# Patient Record
Sex: Female | Born: 1990 | Race: Black or African American | Hispanic: No | Marital: Single | Smoking: Former smoker
Health system: Southern US, Community
[De-identification: ages and names within clinical notes are randomized; demographics above are authoritative.]

## PROBLEM LIST (undated history)

## (undated) ENCOUNTER — Inpatient Hospital Stay (HOSPITAL_COMMUNITY): Payer: Self-pay

## (undated) DIAGNOSIS — D649 Anemia, unspecified: Secondary | ICD-10-CM

## (undated) DIAGNOSIS — I10 Essential (primary) hypertension: Secondary | ICD-10-CM

---
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
## 2010-02-06 ENCOUNTER — Encounter (INDEPENDENT_AMBULATORY_CARE_PROVIDER_SITE_OTHER): Payer: Self-pay | Admitting: *Deleted

## 2010-02-18 ENCOUNTER — Ambulatory Visit: Payer: Self-pay | Admitting: Family

## 2010-02-18 DIAGNOSIS — J45991 Cough variant asthma: Secondary | ICD-10-CM | POA: Insufficient documentation

## 2010-02-18 DIAGNOSIS — R030 Elevated blood-pressure reading, without diagnosis of hypertension: Secondary | ICD-10-CM | POA: Insufficient documentation

## 2011-01-07 NOTE — Letter (Signed)
Summary: New Patient Letter  [facility]  [address] [place], [place] [address]   Phone: [phone]  Fax: [phone]       02/06/2010 MRN: [id number]  ARLETTE SCHAAD [address] [place], [place]  [address]  Dear Ms. Ronn Melena,   Welcome to [facility] and thank you for choosing [place] as your Primary Care Providers. Enclosed you will find information about our practice that we hope you find helpful. We have also enclosed forms to be filled out prior to your visit. This will provide [place] with the necessary information and facilitate your being seen in a timely manner. If you have any questions, please call us at:  [phone]        and we will be happy to assist you. We look forward to seeing you at your scheduled appointment time.  Appointment   MARCH [phone] @ 9:00AM            with .  MELISSA O'SULLIVAN               Sincerely,  Primary Health Care Team  Please arrive 15 minutes early for your first appointment and bring your insurance card. Co-pay is required at the time of your visit.  *****Please call the office if you are not able to keep this appointment. There is a charge of $50.00 if any appointment is not cancelled or rescheduled within 24 hours.

## 2011-01-07 NOTE — Assessment & Plan Note (Signed)
Summary: NEW PT/BCBS/NS/KDC   Vital Signs:  Patient profile:   20 year old female Height:      71 inches Weight:      228 pounds BMI:     31.91 O2 Sat:      99 % on Room air Temp:     97.4 degrees F oral Pulse rate:   81 / minute Pulse rhythm:   regular Resp:     12 per minute BP sitting:   140 / 78  (left arm) Cuff size:   regular  Vitals Entered By: Mervin Kung CMA (February 18, 2010 9:07 AM)  O2 Flow:  Room air Is Patient Diabetic? No   History of Present Illness: Tammy Hull is an 21 year old female who presents today to establish care- she was previously seen at [facility].  Patient notes that she has 4 year history of intermittentsubsternal chest pain- comes and goes.  Pain is sharp in nature- lasts seconds to few minutes.  Notes + cough which is intermittent in nature.  She denies history of asthma, but notes + family history of asthma.     Preventative-  never had a pap smear.  Dances twice a week.  Up to date on tetanus and flu shot.  Notes that her diet consists of a lot of junk food.  Tells me that she has never been sexually active.    Preventive Screening-Counseling & Management  Alcohol-Tobacco     Alcohol drinks/day: 0     Smoking Status: never  Caffeine-Diet-Exercise     Caffeine use/day: none     Does Patient Exercise: yes     Type of exercise: dance     Exercise (avg: min/session): >60     Times/week: <3  Allergies (verified): No Known Drug Allergies  Family History: Family History of Arthritis--great grandmother Family History of Asthma--father Family History Diabetes 1st degree relative--paternal grandmother Family History Hypertension--mother,father, maternal,paternal grandparents Family History High cholesterol--paternal grandmother Family History Lung --maternal grandfather,deceased  Mom- HTN Dad- HTN, asthma  2 half sisters-  healthy  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 Resp: intermittent cough CV:  see HPI GI:  Denies nausea or vomitting GU: Denies dysuria Lymphatic: Denies lymphadenopathy Musculoskeletal:  Denies muscle/joint pain, occasional back pain Skin:  Denies Rashes Psychiatric: Denies depression Neuro: Denies numbness     Physical Exam  General:  Overweight AA female in NAD Head:  Normocephalic and atraumatic without obvious abnormalities. No apparent alopecia or balding. Eyes:  PERRLA Ears:  External ear exam shows no significant lesions or deformities.  Otoscopic examination reveals clear canals, tympanic membranes are intact bilaterally without bulging, retraction, inflammation or discharge. Hearing is grossly normal bilaterally. Mouth:  Oral mucosa and oropharynx without lesions or exudates.  Teeth in good repair. Neck:  No deformities, masses, or tenderness noted. Breasts:  No mass, nodules, thickening, tenderness, bulging, retraction, inflamation, nipple discharge or skin changes noted.   Lungs:  Normal respiratory effort, chest expands symmetrically. Lungs are clear to auscultation, no crackles or wheezes. Heart:  Normal rate and regular rhythm. S1 and S2 normal without gallop, murmur, click, rub or other extra sounds. Abdomen:  Bowel sounds positive,abdomen soft and non-tender without masses, organomegaly or hernias noted. Msk:  No deformity  or scoliosis noted of thoracic or lumbar spine.   Extremities:  No clubbing, cyanosis, edema, or deformity noted with normal full range of motion of all joints.   Neurologic:  No cranial nerve deficits noted. Station and gait are normal. Plantar reflexes are down-going bilaterally. DTRs are symmetrical throughout. Sensory, motor and coordinative functions appear intact. Skin:  Intact  without suspicious lesions or rashes   Impression & Recommendations:  Problem # 1:  Preventive Health Care (ICD-V70.0) Assessment Comment Only Patient advised to try to eat 3 healthy meals a day and avoid junk food.  Also advised to try to exercise 5x a week.  Pt educated on BSE and safe sex.  Will plan for pap at age 24 or when patient becomes sexually active.    Problem # 2:  INCREASED BLOOD PRESSURE (ICD-796.2) Assessment: New new finding, advised patient to follow a low sodium diet, try to lose weight.  Plan f/u in 1 month, if still elevated will start meds at that time.    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  I suspect that patient's chest discomfort is likely related to asthma.  Strong family history and patient has + history of cough.  Will give trial of advair/proair.   Will give trial of advair pt given [id number] with proair as needed [phone]  Her updated medication list for this problem includes:    Advair Diskus 100-50 Mcg/dose Aepb (Fluticasone-salmeterol) ..... One puff twice daily    Proair Hfa 108 (90 Base) Mcg/act Aers (Albuterol sulfate) .Marland Kitchen... 2 puffs every 6 hours as needed  Complete Medication List: 1)  Advair Diskus 100-50 Mcg/dose Aepb (Fluticasone-salmeterol) .... One puff twice daily 2)  Proair Hfa 108 (90 Base) Mcg/act Aers (Albuterol sulfate) .... 2 puffs every 6 hours as needed  Patient Instructions: 1)  It is important that you exercise regularly at least 20 minutes 5 times a week. If you develop chest pain, have severe difficulty breathing, or feel very tired , stop exercising immediately and seek medical attention. 2)  You need to lose weight. Consider a lower calorie diet and regular exercise.  3)  Please follow up in 1 month. 4)  Limit your Sodium (Salt). Prescriptions: ADVAIR DISKUS 100-50 MCG/DOSE AEPB (FLUTICASONE-SALMETEROL) one puff twice daily  #1 x 0   Entered and Authorized by:   Lemont Fillers FNP   Signed by:   Lemont Fillers FNP on 02/18/2010   Method used:   Electronically to        [facility] (retail)       [address]       [place], [place]  [address]       Ph: [phone]       Fax: [phone]   RxID:   [id number]   Current Allergies (reviewed today): No known allergies    Vital Signs:  Patient Profile:   20 year old female Height:     71 inches Weight:      228 pounds BMI:     31.91 O2 Sat:      99 % Temp:     97.4 degrees F oral Pulse rate:   81 / minute Pulse rhythm:   regular Resp:     12 per minute BP sitting:   140 / 78 Cuff size:   regular  Preventive Care Screening  Last Tetanus Booster:    Date:  07/07/2009    Results:  Historical     Immunization History:  Influenza Immunization History:    Influenza:  historical (12/15/2009)

## 2011-01-19 ENCOUNTER — Ambulatory Visit (INDEPENDENT_AMBULATORY_CARE_PROVIDER_SITE_OTHER): Payer: BC Managed Care – PPO | Admitting: Family Medicine

## 2011-01-19 ENCOUNTER — Other Ambulatory Visit: Payer: Self-pay | Admitting: Family Medicine

## 2011-01-19 ENCOUNTER — Encounter: Payer: Self-pay | Admitting: Family Medicine

## 2011-01-19 DIAGNOSIS — K219 Gastro-esophageal reflux disease without esophagitis: Secondary | ICD-10-CM | POA: Insufficient documentation

## 2011-01-19 DIAGNOSIS — R209 Unspecified disturbances of skin sensation: Secondary | ICD-10-CM

## 2011-01-19 DIAGNOSIS — R109 Unspecified abdominal pain: Secondary | ICD-10-CM

## 2011-01-19 DIAGNOSIS — R079 Chest pain, unspecified: Secondary | ICD-10-CM | POA: Insufficient documentation

## 2011-01-19 LAB — TSH: TSH: 1.61 u[IU]/mL (ref 0.35–5.50)

## 2011-01-19 LAB — CBC WITH DIFFERENTIAL/PLATELET
Basophils Absolute: 0 10*3/uL (ref 0.0–0.1)
Eosinophils Absolute: 0 10*3/uL (ref 0.0–0.7)
Eosinophils Relative: 0.6 % (ref 0.0–5.0)
Lymphs Abs: 2.6 10*3/uL (ref 0.7–4.0)
MCHC: 33.5 g/dL (ref 30.0–36.0)
MCV: 85.2 fl (ref 78.0–100.0)
Monocytes Absolute: 0.4 10*3/uL (ref 0.1–1.0)
Neutrophils Relative %: 54.6 % (ref 43.0–77.0)
Platelets: 245 10*3/uL (ref 150.0–400.0)
RDW: 16 % — ABNORMAL HIGH (ref 11.5–14.6)
WBC: 6.9 10*3/uL (ref 4.5–10.5)

## 2011-01-19 LAB — BASIC METABOLIC PANEL
Calcium: 9.1 mg/dL (ref 8.4–10.5)
GFR: 123.34 mL/min (ref >60.00)
Potassium: 3.9 mEq/L (ref 3.5–5.1)
Sodium: 140 mEq/L (ref 135–145)

## 2011-01-19 LAB — B12 AND FOLATE PANEL
Folate: 13.3 ng/mL (ref >5.9)
Vitamin B-12: 348 pg/mL (ref 211–911)

## 2011-01-28 ENCOUNTER — Ambulatory Visit: Payer: BC Managed Care – PPO | Admitting: Family Medicine

## 2011-02-02 NOTE — Assessment & Plan Note (Signed)
Summary: new pt --transferring from [facility]--bcbs--needs to talk ab...   Vital Signs:  Patient profile:   20 year old female Height:      71 inches Weight:      228 pounds BMI:     31.91 Pulse rate:   72 / minute BP sitting:   112 / 74  (left arm)  Vitals Entered By: Doristine Devoid CMA (January 19, 2011 8:03 AM) CC: NEW EST- hx of heartburn, numbness and tingling in foot and hands, along w/ urinary   History of Present Illness: 20 yo woman here today to establish care.  Previous MD- Peggyann Juba.  parasthesias- sxs will occur most frequently in feet.  occasionally in hands.  infrequently.  sxs have been occuring for a few months.  'felt like when you play in the snow and your hands get real cold and then you put hot water on them- hurts like that'.  nothing makes pain better or worse.  not related to any type of footwear.  sxs occur from ball of foot to toes.  suprapubic pain- occurs when pt wakes up.  not daily.  improves after urination.  no blood in urine.  chest pain- has been happening for 'years'.  'i've seen doctors but no one can find anything'.  started in center of chest.  moved to R side.  now predominately L sided.  will resolve spontaneously.  worse w/ 'hot food'.  GERD- 'real bad pain', has never taken meds.  worse w/ spicy food.  will have sour brash.  worse w/ lying down.  Preventive Screening-Counseling & Management  Alcohol-Tobacco     Smoking Status: never  Caffeine-Diet-Exercise     Does Patient Exercise: no      Drug Use:  never.    Current Medications (verified): 1)  Omeprazole 40 Mg Cpdr (Omeprazole) .Marland Kitchen.. 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 No apparent alopecia or balding. Eyes:  PERRLA, EOMI Neck:  No deformities, masses, or tenderness noted. Lungs:  Normal respiratory effort, chest expands symmetrically. Lungs are clear to auscultation, no crackles or wheezes. Heart:  Normal rate and regular rhythm. S1 and S2 normal without gallop, murmur, click, rub or other extra sounds. Abdomen:  Bowel sounds positive,abdomen soft and non-tender without masses, organomegaly or hernias noted. Msk:  fallen transverse arches bilaterally Pulses:  +2 carotid, radial, DP Extremities:  no C/C/E Neurologic:  alert & oriented X3, cranial nerves II-XII intact, sensation intact to light touch, gait normal, and DTRs symmetrical and normal.   Skin:  Intact without suspicious lesions or rashes Cervical Nodes:  No lymphadenopathy noted Psych:  Cognition and judgment appear intact. Alert and cooperative with normal attention span and concentration. No apparent delusions, illusions, hallucinations   Impression & Recommendations:  Problem # 1:  PARESTHESIA (ICD-782.0) Assessment New check labs to r/o thyroid problems, anemia, electrolyte disturbance.  pt most likely having numbness in feet due to collapsed transverse arches.  refer to sports med to offload pressure w/ orthotics. Orders: Specimen Handling (04540) TLB-CBC Platelet - w/Differential (85025-CBCD) TLB-BMP (Basic Metabolic Panel-BMET) (80048-METABOL) TLB-TSH (Thyroid  Stimulating Hormone) (84443-TSH) TLB-B12 + Folate Pnl (60454_09811-B14/NWG) Sports Medicine (Sports Med)  Problem # 2:  SUPRAPUBIC PAIN (ICD-789.09) Assessment: New pt asymptomatic today.  reviewed that sxs are most likely due to overdistension of bladder as things improve after urination.  will follow.  Problem # 3:  CHEST PAIN (ICD-786.50) Assessment: New most likely related to untreated GERD.  has had multiple  workups w/out results.  EKG in office today w/out evidence of cardiac cause.  start PPI and f/u.  Problem # 4:  GERD (ICD-530.81) Assessment: New check H pylori to r/o PUD.  start PPI.   Orders: Venipuncture (95621) Specimen Handling (30865) EKG w/ Interpretation (93000) TLB-H. Pylori Abs(Helicobacter Pylori) (86677-HELICO)  Her updated medication list for this problem includes:    Omeprazole 40 Mg Cpdr (Omeprazole) .Marland Kitchen... 1 tab by mouth daily for heartburn  Complete Medication List: 1)  Omeprazole 40 Mg Cpdr (Omeprazole) .Marland Kitchen.. 1 tab by mouth daily for heartburn  Patient Instructions: 1)  Follow up in 1 month to recheck heartburn 2)  Start the Omeprazole daily for the heartburn 3)  Someone will call you with your sports med appt 4)  We'll notify you of your lab results 5)  Call with any questions or concerns 6)  Welcome!  We're glad to have you! Prescriptions: OMEPRAZOLE 40 MG CPDR (OMEPRAZOLE) 1 tab by mouth daily for heartburn  #30 x 3   Entered and Authorized by:   Neena Rhymes MD   Signed by:   Neena Rhymes MD on 01/19/2011   Method used:   Electronically to        [facility] (retail)       [address]       [place], [place]  [address]       Ph: [phone]       Fax: [phone]   RxID:   [id number]    Orders Added: 1)  Venipuncture [75643] 2)  Specimen Handling [99000] 3)  EKG w/ Interpretation [93000] 4)  TLB-H. Pylori Abs(Helicobacter Pylori) [86677-HELICO] 5)  TLB-CBC Platelet - w/Differential [85025-CBCD] 6)  TLB-BMP (Basic Metabolic Panel-BMET) [80048-METABOL] 7)  TLB-TSH (Thyroid Stimulating Hormone) [84443-TSH] 8)  TLB-B12 + Folate Pnl [82746_82607-B12/FOL] 9)  Est. Patient Level IV [32951] 10)  Sports Medicine [Sports Med]

## 2011-02-10 ENCOUNTER — Encounter: Payer: Self-pay | Admitting: Family Medicine

## 2011-02-18 ENCOUNTER — Ambulatory Visit: Payer: BC Managed Care – PPO | Admitting: Family Medicine

## 2011-02-18 DIAGNOSIS — Z0289 Encounter for other administrative examinations: Secondary | ICD-10-CM

## 2011-05-04 ENCOUNTER — Emergency Department (HOSPITAL_COMMUNITY)
Admission: EM | Admit: 2011-05-04 | Discharge: 2011-05-04 | Disposition: A | Discharge status: Home / Self Care | Payer: BC Managed Care – PPO | Attending: Emergency Medicine | Admitting: Emergency Medicine

## 2011-05-04 DIAGNOSIS — H6120 Impacted cerumen, unspecified ear: Secondary | ICD-10-CM | POA: Insufficient documentation

## 2011-05-04 DIAGNOSIS — H9209 Otalgia, unspecified ear: Secondary | ICD-10-CM | POA: Insufficient documentation

## 2011-11-20 ENCOUNTER — Emergency Department (INDEPENDENT_AMBULATORY_CARE_PROVIDER_SITE_OTHER)
Admission: EM | Admit: 2011-11-20 | Discharge: 2011-11-20 | Disposition: A | Discharge status: Home / Self Care | Payer: BC Managed Care – PPO | Source: Home / Self Care | Attending: Family Medicine | Admitting: Family Medicine

## 2011-11-20 ENCOUNTER — Encounter (HOSPITAL_COMMUNITY): Payer: Self-pay | Admitting: *Deleted

## 2011-11-20 DIAGNOSIS — L0501 Pilonidal cyst with abscess: Secondary | ICD-10-CM

## 2011-11-20 MED ORDER — AMOXICILLIN-POT CLAVULANATE 875-125 MG PO TABS: 1.0000 | ORAL_TABLET | Freq: Two times a day (BID) | ORAL | Status: AC

## 2011-11-20 NOTE — ED Provider Notes (Signed)
History     CSN: [id number] Arrival date & time: 11/20/2011  3:26 PM   First MD Initiated Contact with Patient 11/20/11 1326      Chief Complaint  Patient presents with  . Abscess    (Consider location/radiation/quality/duration/timing/severity/associated sxs/prior treatment) Patient is a 20 y.o. female presenting with abscess. The history is provided by the patient.  Abscess  This is a new problem. The current episode started less than one week ago. The onset was gradual. The problem has been gradually worsening. The abscess is present on the right buttock. The problem is moderate. The abscess is characterized by redness, painfulness and swelling.    History reviewed. No pertinent past medical history.  History reviewed. No pertinent past surgical history.  Family History  Problem Relation Age of Onset  . Hypertension Mother   . Asthma Father   . Hypertension Father   . Hypertension Maternal Grandmother   . Hypertension Maternal Grandfather   . Cancer Maternal Grandfather     lung cancer  . Diabetes Paternal Grandmother   . Hypertension Paternal Grandmother   . Hyperlipidemia Paternal Grandmother   . Hypertension Paternal Grandfather     History  Substance Use Topics  . Smoking status: Never Smoker   . Smokeless tobacco: Not on file  . Alcohol Use: No    OB History    Grav Para Term Preterm Abortions TAB SAB Ect Mult Living                  Review of Systems  Constitutional: Negative.   Gastrointestinal: Positive for rectal pain.  Genitourinary: Negative.   Skin: Positive for rash.    Allergies  Review of patient's allergies indicates no known allergies.  Home Medications   Current Outpatient Rx  Name Route Sig Dispense Refill  . AMOXICILLIN-POT CLAVULANATE 875-125 MG PO TABS Oral Take 1 tablet by mouth 2 (two) times daily. 20 tablet 0  . OMEPRAZOLE 40 MG PO CPDR Oral Take 40 mg by mouth daily.        BP 138/81  Pulse 90  Temp(Src) 98.1 F  (36.7 C) (Oral)  Resp 16  SpO2 100%  LMP 10/29/2011  Physical Exam  Constitutional: She appears well-developed and well-nourished.  Abdominal: She exhibits mass. There is tenderness.    ED Course  INCISION AND DRAINAGE Date/Time: 11/20/2011 4:01 PM Performed by: Barkley Bruns Authorized by: Barkley Bruns Consent: Verbal consent obtained. Consent given by: patient and parent Type: pilonidal cyst Body area: anogenital Location details: pilonidal Anesthesia: local infiltration Local anesthetic: lidocaine 2% with epinephrine Anesthetic total: 3 ml Patient sedated: no Scalpel size: 15 Incision type: single straight Complexity: simple Drainage: purulent Drainage amount: copious Wound treatment: wound left open Packing material: wick placed Patient tolerance: Patient tolerated the procedure well with no immediate complications.   (including critical care time)   Labs Reviewed  WOUND CULTURE   No results found.   1. Pilonidal abscess       MDM          Barkley Bruns, MD 11/20/11 1610

## 2011-11-20 NOTE — ED Notes (Signed)
Pt with ? Abscess bilateral buttocks x 5 days

## 2011-11-22 ENCOUNTER — Encounter (HOSPITAL_COMMUNITY): Payer: Self-pay | Admitting: *Deleted

## 2011-11-22 ENCOUNTER — Emergency Department (INDEPENDENT_AMBULATORY_CARE_PROVIDER_SITE_OTHER)
Admission: EM | Admit: 2011-11-22 | Discharge: 2011-11-22 | Disposition: A | Discharge status: Home / Self Care | Payer: BC Managed Care – PPO | Source: Home / Self Care | Attending: Family Medicine | Admitting: Family Medicine

## 2011-11-22 DIAGNOSIS — L0231 Cutaneous abscess of buttock: Secondary | ICD-10-CM

## 2011-11-22 NOTE — ED Notes (Signed)
Pt here for recheck of abscess to buttocks.  Packing still intact.  Pt states it feels much better since first visit.

## 2011-11-22 NOTE — ED Provider Notes (Signed)
History     CSN: [id number] Arrival date & time: 11/22/2011  1:38 PM   First MD Initiated Contact with Patient 11/22/11 1404      Chief Complaint  Patient presents with  . Wound Check    (Consider location/radiation/quality/duration/timing/severity/associated sxs/prior treatment) Patient is a 20 y.o. female presenting with wound check. The history is provided by the patient.  Wound Check  She was treated in the ED 2 to 3 days ago. Previous treatment in the ED includes I&D of abscess. Treatments since wound repair include oral antibiotics. There has been no drainage from the wound. There is no redness present. There is no swelling present. The pain has no pain.    History reviewed. No pertinent past medical history.  History reviewed. No pertinent past surgical history.  Family History  Problem Relation Age of Onset  . Hypertension Mother   . Asthma Father   . Hypertension Father   . Hypertension Maternal Grandmother   . Hypertension Maternal Grandfather   . Cancer Maternal Grandfather     lung cancer  . Diabetes Paternal Grandmother   . Hypertension Paternal Grandmother   . Hyperlipidemia Paternal Grandmother   . Hypertension Paternal Grandfather     History  Substance Use Topics  . Smoking status: Never Smoker   . Smokeless tobacco: Not on file  . Alcohol Use: No    OB History    Grav Para Term Preterm Abortions TAB SAB Ect Mult Living                  Review of Systems  Constitutional: Negative.   Skin: Positive for wound.    Allergies  Review of patient's allergies indicates no known allergies.  Home Medications   Current Outpatient Rx  Name Route Sig Dispense Refill  . AMOXICILLIN-POT CLAVULANATE 875-125 MG PO TABS Oral Take 1 tablet by mouth 2 (two) times daily. 20 tablet 0  . OMEPRAZOLE 40 MG PO CPDR Oral Take 40 mg by mouth daily.        BP 130/75  Pulse 80  Temp(Src) 98.6 F (37 C) (Oral)  Resp 16  SpO2 100%  LMP 10/29/2011  Physical  Exam  Constitutional: She appears well-developed and well-nourished.  Abdominal: Soft. Bowel sounds are normal.  Skin:       ED Course  Procedures (including critical care time)  Labs Reviewed - No data to display No results found.   1. Abscess of buttock, right       MDM  Packing removed, healing well        Barkley Bruns, MD 11/22/11 [phone]

## 2011-11-23 LAB — CULTURE, ROUTINE-ABSCESS

## 2012-11-24 ENCOUNTER — Ambulatory Visit: Payer: BC Managed Care – PPO | Admitting: Family Medicine

## 2013-01-16 ENCOUNTER — Encounter: Payer: BC Managed Care – PPO | Admitting: Family Medicine

## 2013-03-06 ENCOUNTER — Encounter: Payer: BC Managed Care – PPO | Admitting: Family Medicine

## 2013-08-30 ENCOUNTER — Ambulatory Visit (INDEPENDENT_AMBULATORY_CARE_PROVIDER_SITE_OTHER): Payer: BC Managed Care – PPO | Admitting: Family Medicine

## 2013-08-30 ENCOUNTER — Encounter: Payer: Self-pay | Admitting: Family Medicine

## 2013-08-30 VITALS — BP 120/72 | HR 54 | Temp 98.0°F | Wt 242.0 lb

## 2013-08-30 DIAGNOSIS — J309 Allergic rhinitis, unspecified: Secondary | ICD-10-CM

## 2013-08-30 DIAGNOSIS — H1013 Acute atopic conjunctivitis, bilateral: Secondary | ICD-10-CM

## 2013-08-30 DIAGNOSIS — T7840XA Allergy, unspecified, initial encounter: Secondary | ICD-10-CM

## 2013-08-30 DIAGNOSIS — H1010 Acute atopic conjunctivitis, unspecified eye: Secondary | ICD-10-CM

## 2013-08-30 MED ORDER — CETIRIZINE HCL 10 MG PO TABS: 10.0000 mg | ORAL_TABLET | Freq: Every day | ORAL | Status: DC

## 2013-08-30 MED ORDER — AZELASTINE HCL 0.05 % OP SOLN: 1.0000 [drp] | Freq: Two times a day (BID) | OPHTHALMIC | Status: DC

## 2013-08-30 MED ORDER — OMEPRAZOLE 40 MG PO CPDR: 40.0000 mg | DELAYED_RELEASE_CAPSULE | Freq: Every day | ORAL | Status: DC

## 2013-08-30 NOTE — Assessment & Plan Note (Signed)
Zyrtec otc optivar Check allergy test Consider allergy referral

## 2013-08-30 NOTE — Progress Notes (Signed)
  Subjective:    Patient ID: Tammy Hull, female    DOB: 02-04-1991, 22 y.o.   MRN: [id number]  HPI Pt here c/o eyes swelling and runny --- worsening over the last 2 weeks.  Pt works as a Producer, television/film/video.  She started 1 month ago. Never had any trouble before 1 month ago.  No sob, cp,  + recent swelling of tongue. She has been taking benadryl and otc allergy drops.  Symptoms improve with otc meds but yesterday she had some swelling of the tongue as well.    Review of Systems As above    Objective:   Physical Exam BP 120/72  Pulse 54  Temp(Src) 98 F (36.7 C) (Oral)  Wt 242 lb (109.77 kg)  BMI 33.77 kg/m2  SpO2 97% General appearance: alert, cooperative, appears stated age and no distress Head: Normocephalic, without obvious abnormality, atraumatic Eyes: conjunctivae/corneas clear. PERRL, EOM's intact. Fundi benign. Ears: normal TM's and external ear canals both ears Nose: clear discharge, mild congestion, turbinates red, swollen Throat: lips, mucosa, and tongue normal; teeth and gums normal Neck: no adenopathy, supple, symmetrical, trachea midline and thyroid not enlarged, symmetric, no tenderness/mass/nodules Lungs: clear to auscultation bilaterally Heart: S1, S2 normal        Assessment & Plan:

## 2013-08-30 NOTE — Patient Instructions (Signed)

## 2013-08-31 ENCOUNTER — Other Ambulatory Visit: Payer: Self-pay | Admitting: General Practice

## 2013-08-31 DIAGNOSIS — T7840XA Allergy, unspecified, initial encounter: Secondary | ICD-10-CM

## 2013-08-31 DIAGNOSIS — H1013 Acute atopic conjunctivitis, bilateral: Secondary | ICD-10-CM

## 2013-08-31 LAB — ~~LOC~~ ALLERGY PANEL
Allergen, Cedar tree, t12: <0.1 kU/L
Allergen, Comm Silver Birch, t9: <0.1 kU/L
Allergen, Mulberry, t76: <0.1 kU/L
Alternaria Alternata: <0.1 kU/L
Cat Dander: <0.1 kU/L
Common Ragweed: <0.1 kU/L
D. farinae: 0.2 kU/L — ABNORMAL HIGH
Dog Dander: <0.1 kU/L
Johnson Grass: <0.1 kU/L
Mucor Racemosus: <0.1 kU/L
Mugwort: <0.1 kU/L
Nettle: <0.1 kU/L
Pecan/Hickory Tree IgE: <0.1 kU/L
Penicillium Notatum: <0.1 kU/L
Plantain: <0.1 kU/L
Stemphylium Botryosum: <0.1 kU/L
Timothy Grass: <0.1 kU/L

## 2013-08-31 MED ORDER — AZELASTINE HCL 0.05 % OP SOLN: 1.0000 [drp] | Freq: Two times a day (BID) | OPHTHALMIC | Status: DC

## 2013-08-31 MED ORDER — OMEPRAZOLE 40 MG PO CPDR: 40.0000 mg | DELAYED_RELEASE_CAPSULE | Freq: Every day | ORAL | Status: DC

## 2013-08-31 MED ORDER — CETIRIZINE HCL 10 MG PO TABS: 10.0000 mg | ORAL_TABLET | Freq: Every day | ORAL | Status: DC

## 2013-11-07 ENCOUNTER — Ambulatory Visit (INDEPENDENT_AMBULATORY_CARE_PROVIDER_SITE_OTHER): Payer: BC Managed Care – PPO | Admitting: Family Medicine

## 2013-11-07 ENCOUNTER — Encounter: Payer: Self-pay | Admitting: Family Medicine

## 2013-11-07 VITALS — BP 128/72 | HR 90 | Temp 98.1°F | Resp 16 | Wt 247.4 lb

## 2013-11-07 DIAGNOSIS — M62838 Other muscle spasm: Secondary | ICD-10-CM | POA: Insufficient documentation

## 2013-11-07 DIAGNOSIS — J309 Allergic rhinitis, unspecified: Secondary | ICD-10-CM

## 2013-11-07 DIAGNOSIS — J45991 Cough variant asthma: Secondary | ICD-10-CM

## 2013-11-07 MED ORDER — ALBUTEROL SULFATE HFA 108 (90 BASE) MCG/ACT IN AERS: 2.0000 | INHALATION_SPRAY | RESPIRATORY_TRACT | Status: DC | PRN

## 2013-11-07 MED ORDER — CYCLOBENZAPRINE HCL 10 MG PO TABS: 10.0000 mg | ORAL_TABLET | Freq: Three times a day (TID) | ORAL | Status: DC | PRN

## 2013-11-07 MED ORDER — NAPROXEN 500 MG PO TABS: 500.0000 mg | ORAL_TABLET | Freq: Two times a day (BID) | ORAL | Status: AC

## 2013-11-07 MED ORDER — FLUTICASONE PROPIONATE 50 MCG/ACT NA SUSP: 2.0000 | Freq: Every day | NASAL | Status: DC

## 2013-11-07 MED ORDER — BECLOMETHASONE DIPROPIONATE 40 MCG/ACT IN AERS: 1.0000 | INHALATION_SPRAY | Freq: Two times a day (BID) | RESPIRATORY_TRACT | Status: DC

## 2013-11-07 MED ORDER — ALBUTEROL SULFATE (2.5 MG/3ML) 0.083% IN NEBU
2.5000 mg | INHALATION_SOLUTION | Freq: Once | RESPIRATORY_TRACT | Status: AC
  Administered 2013-11-07: 2.5 mg via RESPIRATORY_TRACT

## 2013-11-07 NOTE — Patient Instructions (Signed)
Follow up as needed Start the Qvar- 1 puff twice daily to control your symptoms Use the albuterol as needed for shortness of breath or coughing spells Restart Zyrtec daily for allergy meds Use the Flonase- 2 sprays each nostril daily to decrease congestion and post-nasal drip Start the Naproxen twice daily x7 days (w/ food) and then as needed for neck/shoulder/arm pain Use the muscle relaxer (flexeril) at night- will cause drowsiness Call with any questions or concerns Hang in there! Happy Holidays!

## 2013-11-07 NOTE — Progress Notes (Signed)
   Subjective:    Patient ID: Tammy Hull, female    DOB: 1991-08-03, 22 y.o.   MRN: [id number]  HPI Pre visit review using our clinic review tool, if applicable. No additional management support is needed unless otherwise documented below in the visit note.  URI- pt reports that she noted chest rattling this summer, sxs went away.  In the last month has developed barking cough, worse at night, keeping her up at night.  No fever.  + HAs.  No sinus pain/pressure.  Minimal nasal congestion.  Hx of cough variant asthma.  R shoulder pain- started yesterday after coughing fit.  Now having pain from neck into shoulder and radiating down arm.  Some numbness.  No weakness.  Review of Systems For ROS see HPI     Objective:   Physical Exam  Vitals reviewed. Constitutional: She appears well-developed and well-nourished. No distress.  HENT:  Head: Normocephalic and atraumatic.  Right Ear: Tympanic membrane normal.  Left Ear: Tympanic membrane normal.  Nose: Mucosal edema and rhinorrhea present. Right sinus exhibits no maxillary sinus tenderness and no frontal sinus tenderness. Left sinus exhibits no maxillary sinus tenderness and no frontal sinus tenderness.  Mouth/Throat: Mucous membranes are normal. Posterior oropharyngeal erythema (w/ PND) present.  Eyes: Conjunctivae and EOM are normal. Pupils are equal, round, and reactive to light.  Neck: Normal range of motion. Neck supple.  R trap spasm  Cardiovascular: Normal rate, regular rhythm and normal heart sounds.   Pulmonary/Chest: Effort normal and breath sounds normal. No respiratory distress. She has no wheezes. She has no rales.  Barking cough  Lymphadenopathy:    She has no cervical adenopathy.          Assessment & Plan:

## 2013-11-11 NOTE — Assessment & Plan Note (Signed)
Suspect this is the trigger for pt's cough variant asthma.  Start OTC antihistamine and nasal steroid spray.  Reviewed supportive care and red flags that should prompt return.  Pt expressed understanding and is in agreement w/ plan.

## 2013-11-11 NOTE — Assessment & Plan Note (Signed)
Deteriorated.  Start daily controller medication.  Use albuterol as rescue med.  Reviewed supportive care and red flags that should prompt return.  Pt expressed understanding and is in agreement w/ plan.

## 2013-11-11 NOTE — Assessment & Plan Note (Signed)
New.  Start scheduled NSAIDs and flexeril prn.  Reviewed supportive care and red flags that should prompt return.  Pt expressed understanding and is in agreement w/ plan.

## 2016-12-08 ENCOUNTER — Ambulatory Visit (INDEPENDENT_AMBULATORY_CARE_PROVIDER_SITE_OTHER): Payer: BLUE CROSS/BLUE SHIELD | Admitting: Physician Assistant

## 2016-12-08 VITALS — BP 138/88 | HR 106 | Temp 98.6°F | Resp 17 | Ht 71.5 in | Wt 293.0 lb

## 2016-12-08 DIAGNOSIS — H6122 Impacted cerumen, left ear: Secondary | ICD-10-CM

## 2016-12-08 NOTE — Patient Instructions (Signed)
     IF you received an x-ray today, you will receive an invoice from [facility]. Please contact  Radiology at [phone] with questions or concerns regarding your invoice.   IF you received labwork today, you will receive an invoice from [facility]. Please contact [facility] at [phone] with questions or concerns regarding your invoice.   Our billing staff will not be able to assist you with questions regarding bills from these companies.  You will be contacted with the lab results as soon as they are available. The fastest way to get your results is to activate your My Chart account. Instructions are located on the last page of this paperwork. If you have not heard from us regarding the results in 2 weeks, please contact this office.     

## 2016-12-08 NOTE — Progress Notes (Signed)
   Tammy Hull  MRN: [id number] DOB: December 10, 1990  Subjective:  Pt presents to clinic left ear pain more than the right - feels blocked up and she is having trouble hearing.  She has had problems with wax in the past and she thinks that is what the problem is.  She has stopped using cotton swabs and is just using her towel to dry her ears.  Review of Systems  Patient Active Problem List   Diagnosis Date Noted  . Allergic rhinitis 11/07/2013  . Trapezius muscle spasm 11/07/2013  . Allergic reaction 08/30/2013  . GERD 01/19/2011  . PARESTHESIA 01/19/2011  . CHEST PAIN 01/19/2011  . SUPRAPUBIC PAIN 01/19/2011  . COUGH VARIANT ASTHMA 02/18/2010  . INCREASED BLOOD PRESSURE 02/18/2010    No current outpatient prescriptions on file prior to visit.   No current facility-administered medications on file prior to visit.     No Known Allergies  Pt patients past, family and social history were reviewed and updated.   Objective:  BP 138/88 (BP Location: Right Arm, Patient Position: Sitting, Cuff Size: Normal)   Pulse (!) 106   Temp 98.6 F (37 C) (Oral)   Resp 17   Ht 5' 11.5" (1.816 m)   Wt 293 lb (132.9 kg)   LMP 11/09/2016 (Approximate)   SpO2 99%   BMI 40.30 kg/m   Physical Exam  Constitutional: She is oriented to person, place, and time and well-developed, well-nourished, and in no distress.  HENT:  Head: Normocephalic and atraumatic.  Right Ear: Hearing and external ear normal. A foreign body (cerumen impaction) is present.  Left Ear: Hearing and external ear normal.  Lavage cleared the cerumen impaction.  Eyes: Conjunctivae are normal.  Neck: Normal range of motion.  Pulmonary/Chest: Effort normal.  Neurological: She is alert and oriented to person, place, and time. Gait normal.  Skin: Skin is warm and dry.  Psychiatric: Mood, memory, affect and judgment normal.  Vitals reviewed.   Assessment and Plan :  Impacted cerumen of left ear - Plan: Ear wax removal    Removed with lavage - d/w pt how to prevent in the future.  Benny LennertSarah Amali Uhls PA-C  [facility] 12/08/2016 2:55 PM

## 2017-09-13 ENCOUNTER — Encounter (HOSPITAL_COMMUNITY): Payer: Self-pay | Admitting: Emergency Medicine

## 2017-09-13 DIAGNOSIS — F17200 Nicotine dependence, unspecified, uncomplicated: Secondary | ICD-10-CM | POA: Insufficient documentation

## 2017-09-13 DIAGNOSIS — L0501 Pilonidal cyst with abscess: Secondary | ICD-10-CM | POA: Insufficient documentation

## 2017-09-13 NOTE — ED Triage Notes (Signed)
Patient complaining of abscess in the crack of her butt that is going up her back. This is causing the back pain. Patient states it started three days ago.

## 2017-09-14 ENCOUNTER — Emergency Department (HOSPITAL_COMMUNITY)
Admission: EM | Admit: 2017-09-14 | Discharge: 2017-09-14 | Disposition: A | Discharge status: Home / Self Care | Payer: Self-pay | Attending: Emergency Medicine | Admitting: Emergency Medicine

## 2017-09-14 DIAGNOSIS — L0501 Pilonidal cyst with abscess: Secondary | ICD-10-CM

## 2017-09-14 MED ORDER — OXYCODONE-ACETAMINOPHEN 5-325 MG PO TABS
1.0000 | ORAL_TABLET | Freq: Once | ORAL | Status: AC
  Administered 2017-09-14: 1 via ORAL
  Filled 2017-09-14: qty 1

## 2017-09-14 MED ORDER — ONDANSETRON 4 MG PO TBDP
4.0000 mg | ORAL_TABLET | Freq: Once | ORAL | Status: AC
  Administered 2017-09-14: 4 mg via ORAL
  Filled 2017-09-14: qty 1

## 2017-09-14 MED ORDER — LIDOCAINE-EPINEPHRINE (PF) 2 %-1:200000 IJ SOLN
20.0000 mL | Freq: Once | INTRAMUSCULAR | Status: AC
  Administered 2017-09-14: 20 mL
  Filled 2017-09-14: qty 20

## 2017-09-14 MED ORDER — LIDOCAINE-EPINEPHRINE (PF) 2 %-1:200000 IJ SOLN
INTRAMUSCULAR | Status: AC
  Filled 2017-09-14: qty 20

## 2017-09-14 NOTE — Discharge Instructions (Signed)
You were seen today for pilonidal cyst. It does not appear infected at this time. Keep packing in for at least 2 days. Sitz baths. You may need follow-up with general surgery given that this has recurred.

## 2017-09-14 NOTE — ED Provider Notes (Signed)
[facility]-EMERGENCY DEPT Provider Note   CSN: [id number] Arrival date & time: 09/13/17  2125     History   Chief Complaint Chief Complaint  Patient presents with  . Abscess  . Back Pain    HPI Tammy Hull is a 26 y.o. female.  HPI  This a 26 year old female who presents with buttock abscess. Patient reports 4 day history of abscess noted in the gluteal cleft. She reports history of the same. She's had to have these drained before. Currently she rates her pain at 8 out of 10. Denies any fevers. She has never seen a Careers adviser for this problem.  History reviewed. No pertinent past medical history.  Patient Active Problem List   Diagnosis Date Noted  . Allergic rhinitis 11/07/2013  . Trapezius muscle spasm 11/07/2013  . Allergic reaction 08/30/2013  . GERD 01/19/2011  . PARESTHESIA 01/19/2011  . CHEST PAIN 01/19/2011  . SUPRAPUBIC PAIN 01/19/2011  . COUGH VARIANT ASTHMA 02/18/2010  . INCREASED BLOOD PRESSURE 02/18/2010    History reviewed. No pertinent surgical history.  OB History    No data available       Home Medications    Prior to Admission medications   Not on File    Family History Family History  Problem Relation Age of Onset  . Hypertension Mother   . Asthma Father   . Hypertension Father   . Hypertension Maternal Grandmother   . Hypertension Maternal Grandfather   . Cancer Maternal Grandfather        lung cancer  . Diabetes Paternal Grandmother   . Hypertension Paternal Grandmother   . Hyperlipidemia Paternal Grandmother   . Hypertension Paternal Grandfather     Social History Social History  Substance Use Topics  . Smoking status: Current Some Day Smoker  . Smokeless tobacco: Never Used  . Alcohol use No     Allergies   Patient has no known allergies.   Review of Systems Review of Systems  Constitutional: Negative for fever.  Respiratory: Negative for shortness of breath.   Cardiovascular: Negative for chest pain.    Gastrointestinal: Negative for abdominal pain.  Skin:       Abscess  All other systems reviewed and are negative.    Physical Exam Updated Vital Signs BP (!) 167/106 (BP Location: Left Arm)   Pulse (!) 109   Temp 98.4 F (36.9 C) (Oral)   Resp 18   Ht  (1.803 m)   Wt 127.8 kg (281 lb 11.2 oz)   LMP 08/24/2017   SpO2 100%   BMI 39.29 kg/m   Physical Exam  Constitutional: She is oriented to person, place, and time. She appears well-developed and well-nourished.  Morbidly obese  HENT:  Head: Normocephalic and atraumatic.  Cardiovascular: Normal rate and regular rhythm.   Pulmonary/Chest: Effort normal. No respiratory distress.  Neurological: She is alert and oriented to person, place, and time.  Skin: Skin is warm and dry.  Fluctuance and induration just superior to the gluteal cleft with large pustule noted, no significant erythema  Psychiatric: She has a normal mood and affect.  Nursing note and vitals reviewed.    ED Treatments / Results  Labs (all labs ordered are listed, but only abnormal results are displayed) Labs Reviewed - No data to display  EKG  EKG Interpretation None       Radiology No results found.  Procedures .Marland KitchenIncision and Drainage Date/Time: 09/14/2017 2:33 AM Performed by: Shon Baton Authorized by: Shon Baton  Consent:    Consent obtained:  Verbal   Consent given by:  Patient   Risks discussed:  Bleeding, incomplete drainage and pain   Alternatives discussed:  No treatment Location:    Type:  Abscess   Size:  3 x 3 cm   Location:  Anogenital   Anogenital location:  Pilonidal Pre-procedure details:    Skin preparation:  Antiseptic wash and Betadine Sedation:    Sedation type:  Anxiolysis Anesthesia (see MAR for exact dosages):    Anesthesia method:  Local infiltration   Local anesthetic:  Lidocaine 2% WITH epi Procedure type:    Complexity:  Simple Procedure details:    Needle aspiration: no      Incision types:  Stab incision   Scalpel blade:  11   Wound management:  Probed and deloculated   Drainage:  Purulent   Drainage amount:  Copious   Wound treatment:  Wound left open   Packing materials:  1/4 in iodoform gauze Post-procedure details:    Patient tolerance of procedure:  Tolerated well, no immediate complications   (including critical care time)  Medications Ordered in ED Medications  lidocaine-EPINEPHrine (XYLOCAINE W/EPI) 2 %-1:200000 (PF) injection (not administered)  oxyCODONE-acetaminophen (PERCOCET/ROXICET) 5-325 MG per tablet 1 tablet (1 tablet Oral Given 09/14/17 0148)  lidocaine-EPINEPHrine (XYLOCAINE W/EPI) 2 %-1:200000 (PF) injection 20 mL (20 mLs Infiltration Given 09/14/17 0148)  ondansetron (ZOFRAN-ODT) disintegrating tablet 4 mg (4 mg Oral Given 09/14/17 0147)     Initial Impression / Assessment and Plan / ED Course  I have reviewed the triage vital signs and the nursing notes.  Pertinent labs & imaging results that were available during my care of the patient were reviewed by me and considered in my medical decision making (see chart for details).     Patient presents with an abscess to buttock. Appears consistent with pilonidal abscess. History of the same. No indication of cellulitis or overlying infection. This was drained at the bedside without incident. Packing was placed. Patient was given follow-up with general surgery.  After history, exam, and medical workup I feel the patient has been appropriately medically screened and is safe for discharge home. Pertinent diagnoses were discussed with the patient. Patient was given return precautions.   Final Clinical Impressions(s) / ED Diagnoses   Final diagnoses:  Pilonidal abscess    New Prescriptions New Prescriptions   No medications on file     Shon Baton, MD 09/14/17 [phone]

## 2017-09-14 NOTE — ED Notes (Signed)
Bed: WA06 Expected date:  Expected time:  Means of arrival:  Comments: 

## 2017-09-14 NOTE — ED Notes (Signed)
Pt states this abscess has been there about a week and her lower back is swollen from it and it's very sore

## 2018-05-09 ENCOUNTER — Other Ambulatory Visit: Payer: Self-pay

## 2018-05-09 ENCOUNTER — Encounter (HOSPITAL_COMMUNITY): Payer: Self-pay

## 2018-05-09 ENCOUNTER — Emergency Department (HOSPITAL_COMMUNITY): Payer: Self-pay

## 2018-05-09 ENCOUNTER — Emergency Department (HOSPITAL_COMMUNITY)
Admission: EM | Admit: 2018-05-09 | Discharge: 2018-05-10 | Disposition: A | Discharge status: Home / Self Care | Payer: Self-pay | Attending: Emergency Medicine | Admitting: Emergency Medicine

## 2018-05-09 DIAGNOSIS — O418X10 Other specified disorders of amniotic fluid and membranes, first trimester, not applicable or unspecified: Secondary | ICD-10-CM

## 2018-05-09 DIAGNOSIS — F17200 Nicotine dependence, unspecified, uncomplicated: Secondary | ICD-10-CM | POA: Insufficient documentation

## 2018-05-09 DIAGNOSIS — O99331 Smoking (tobacco) complicating pregnancy, first trimester: Secondary | ICD-10-CM | POA: Insufficient documentation

## 2018-05-09 DIAGNOSIS — I10 Essential (primary) hypertension: Secondary | ICD-10-CM

## 2018-05-09 DIAGNOSIS — O468X1 Other antepartum hemorrhage, first trimester: Secondary | ICD-10-CM

## 2018-05-09 DIAGNOSIS — D509 Iron deficiency anemia, unspecified: Secondary | ICD-10-CM | POA: Insufficient documentation

## 2018-05-09 DIAGNOSIS — O200 Threatened abortion: Secondary | ICD-10-CM | POA: Insufficient documentation

## 2018-05-09 DIAGNOSIS — R102 Pelvic and perineal pain: Secondary | ICD-10-CM | POA: Insufficient documentation

## 2018-05-09 DIAGNOSIS — O4690 Antepartum hemorrhage, unspecified, unspecified trimester: Secondary | ICD-10-CM

## 2018-05-09 DIAGNOSIS — O10011 Pre-existing essential hypertension complicating pregnancy, first trimester: Secondary | ICD-10-CM | POA: Insufficient documentation

## 2018-05-09 DIAGNOSIS — Z3A01 Less than 8 weeks gestation of pregnancy: Secondary | ICD-10-CM | POA: Insufficient documentation

## 2018-05-09 DIAGNOSIS — Z79899 Other long term (current) drug therapy: Secondary | ICD-10-CM | POA: Insufficient documentation

## 2018-05-09 LAB — CBC WITH DIFFERENTIAL/PLATELET
BASOS ABS: 0 10*3/uL (ref 0.0–0.1)
Basophils Relative: 0 %
EOS PCT: 0 %
Eosinophils Absolute: 0 10*3/uL (ref 0.0–0.7)
HEMATOCRIT: 32.6 % — AB (ref 36.0–46.0)
HEMOGLOBIN: 9.9 g/dL — AB (ref 12.0–15.0)
LYMPHS ABS: 2.3 10*3/uL (ref 0.7–4.0)
LYMPHS PCT: 27 %
MCH: 23.3 pg — ABNORMAL LOW (ref 26.0–34.0)
MCHC: 30.4 g/dL (ref 30.0–36.0)
MCV: 76.9 fL — AB (ref 78.0–100.0)
Monocytes Absolute: 0.3 10*3/uL (ref 0.1–1.0)
Monocytes Relative: 4 %
NEUTROS ABS: 5.9 10*3/uL (ref 1.7–7.7)
NEUTROS PCT: 69 %
PLATELETS: 293 10*3/uL (ref 150–400)
RBC: 4.24 MIL/uL (ref 3.87–5.11)
RDW: 18.1 % — ABNORMAL HIGH (ref 11.5–15.5)
WBC: 8.6 10*3/uL (ref 4.0–10.5)

## 2018-05-09 LAB — URINALYSIS, ROUTINE W REFLEX MICROSCOPIC
BILIRUBIN URINE: NEGATIVE
Glucose, UA: NEGATIVE mg/dL
KETONES UR: NEGATIVE mg/dL
Nitrite: NEGATIVE
Protein, ur: NEGATIVE mg/dL
SPECIFIC GRAVITY, URINE: 1.021 (ref 1.005–1.030)
pH: 8 (ref 5.0–8.0)

## 2018-05-09 LAB — HCG, QUANTITATIVE, PREGNANCY: hCG, Beta Chain, Quant, S: 31533 m[IU]/mL — ABNORMAL HIGH (ref <5)

## 2018-05-09 NOTE — ED Triage Notes (Signed)
Pt reports mild abdominal pain and vaginal  spotting around May 9th. Pt reports that she took a home pregnancy test and states she was pregnant and  wanted to confirm, and is having intermittent nausea and vomiting.

## 2018-05-09 NOTE — ED Provider Notes (Signed)
[facility]-EMERGENCY DEPT Provider Note   CSN: [id number] Arrival date & time: 05/09/18  1932     History   Chief Complaint Chief Complaint  Patient presents with  . Vaginal Bleeding  . Possible Pregnancy    HPI Tammy Hull is a 27 y.o. female G1P0 with a hx of GERD, asthma, presents to the Emergency Department complaining of gradual, persistent vaginal bleeding onset May 9th.  Pt reports spotting that is sometimes bright red and sometimes brown.  Pt reports she is changing a panty liner several times per day. Associated symptoms include intermittent lower abd pain.  Nothing aggravates the bleeding or pain and nothing makes it better.  Pt reports she does not have an OB/GYN and has not had any prenatal care.  Her positive home pregnancy test was 2 weeks ago.  Pt reports intermittent nausea and vomiting.  Pt reports she has been sexually active after her positive pregnancy test but this does not worsen the bleeding.   Pt denies fever, chills, headache, neck pain, chest pain, SOB, diarrhea, weakness, dizziness, syncope, dysuria, vaginal discharge, other urinary symptoms.  LMP: April 9th, 2019   The history is provided by the patient, medical records and a significant other. No language interpreter was used.    History reviewed. No pertinent past medical history.  Patient Active Problem List   Diagnosis Date Noted  . Allergic rhinitis 11/07/2013  . Trapezius muscle spasm 11/07/2013  . Allergic reaction 08/30/2013  . GERD 01/19/2011  . PARESTHESIA 01/19/2011  . CHEST PAIN 01/19/2011  . SUPRAPUBIC PAIN 01/19/2011  . COUGH VARIANT ASTHMA 02/18/2010  . INCREASED BLOOD PRESSURE 02/18/2010    History reviewed. No pertinent surgical history.   OB History    Gravida  1   Para      Term      Preterm      AB      Living        SAB      TAB      Ectopic      Multiple      Live Births               Home Medications    Prior to  Admission medications   Medication Sig Start Date End Date Taking? Authorizing Provider  Prenatal Vit-Fe Fumarate-FA (PRENATAL COMPLETE) 14-0.4 MG TABS Take 2 tablets by mouth daily. 05/10/18   Ohm Dentler, Dahlia Client, PA-C    Family History Family History  Problem Relation Age of Onset  . Hypertension Mother   . Asthma Father   . Hypertension Father   . Hypertension Maternal Grandmother   . Hypertension Maternal Grandfather   . Cancer Maternal Grandfather        lung cancer  . Diabetes Paternal Grandmother   . Hypertension Paternal Grandmother   . Hyperlipidemia Paternal Grandmother   . Hypertension Paternal Grandfather     Social History Social History   Tobacco Use  . Smoking status: Current Some Day Smoker  . Smokeless tobacco: Never Used  Substance Use Topics  . Alcohol use: No  . Drug use: No     Allergies   Patient has no known allergies.   Review of Systems Review of Systems  Constitutional: Negative for appetite change, diaphoresis, fatigue, fever and unexpected weight change.  HENT: Negative for mouth sores.   Eyes: Negative for visual disturbance.  Respiratory: Negative for cough, chest tightness, shortness of breath and wheezing.   Cardiovascular: Negative for chest  pain.  Gastrointestinal: Positive for abdominal pain ( intermittent, cramping). Negative for constipation, diarrhea, nausea and vomiting.  Endocrine: Negative for polydipsia, polyphagia and polyuria.  Genitourinary: Positive for vaginal bleeding. Negative for dysuria, frequency, hematuria and urgency.  Musculoskeletal: Negative for back pain and neck stiffness.  Skin: Negative for rash.  Allergic/Immunologic: Negative for immunocompromised state.  Neurological: Negative for syncope, light-headedness and headaches.  Hematological: Does not bruise/bleed easily.  Psychiatric/Behavioral: Negative for sleep disturbance. The patient is not nervous/anxious.      Physical Exam Updated Vital  Signs BP (!) 153/87 (BP Location: Left Arm)   Pulse 80   Temp 98.4 F (36.9 C) (Oral)   Resp 15   Ht 5\' 11"  (1.803 m)   Wt 113.6 kg (250 lb 6.4 oz)   LMP 08/24/2017   SpO2 100%   BMI 34.92 kg/m   Physical Exam  Constitutional: She appears well-developed and well-nourished. No distress.  Awake, alert, nontoxic appearance  HENT:  Head: Normocephalic and atraumatic.  Mouth/Throat: Oropharynx is clear and moist. No oropharyngeal exudate.  Eyes: Conjunctivae are normal. No scleral icterus.  Neck: Normal range of motion. Neck supple.  Cardiovascular: Normal rate, regular rhythm, normal heart sounds and intact distal pulses.  No murmur heard. Pulmonary/Chest: Effort normal and breath sounds normal. No respiratory distress. She has no wheezes.  Equal chest expansion  Abdominal: Soft. Bowel sounds are normal. She exhibits no mass. There is no tenderness. There is no rebound and no guarding. Hernia confirmed negative in the right inguinal area and confirmed negative in the left inguinal area.  Genitourinary: Uterus normal. No labial fusion. There is no rash, tenderness or lesion on the right labia. There is no rash, tenderness or lesion on the left labia. Uterus is not deviated, not enlarged, not fixed and not tender. Cervix exhibits no motion tenderness, no discharge and no friability. Right adnexum displays no mass, no tenderness and no fullness. Left adnexum displays no mass, no tenderness and no fullness. There is bleeding (small amount of bright red blood) in the vagina. No erythema or tenderness in the vagina. No foreign body in the vagina. No signs of injury around the vagina. No vaginal discharge found.  Musculoskeletal: Normal range of motion. She exhibits no edema.  Lymphadenopathy:       Right: No inguinal adenopathy present.       Left: No inguinal adenopathy present.  Neurological: She is alert.  Speech is clear and goal oriented Moves extremities without ataxia  Skin: Skin is  warm and dry. She is not diaphoretic. No erythema.  Psychiatric: She has a normal mood and affect.  Nursing note and vitals reviewed.    ED Treatments / Results  Labs (all labs ordered are listed, but only abnormal results are displayed) Labs Reviewed  WET PREP, GENITAL - Abnormal; Notable for the following components:      Result Value   WBC, Wet Prep HPF POC FEW (*)    All other components within normal limits  HCG, QUANTITATIVE, PREGNANCY - Abnormal; Notable for the following components:   hCG, Beta Chain, Quant, S 31,533 (*)    All other components within normal limits  COMPREHENSIVE METABOLIC PANEL - Abnormal; Notable for the following components:   Calcium 8.8 (*)    AST 12 (*)    All other components within normal limits  CBC WITH DIFFERENTIAL/PLATELET - Abnormal; Notable for the following components:   Hemoglobin 9.9 (*)    HCT 32.6 (*)    MCV 76.9 (*)  MCH 23.3 (*)    RDW 18.1 (*)    All other components within normal limits  URINALYSIS, ROUTINE W REFLEX MICROSCOPIC - Abnormal; Notable for the following components:   APPearance CLOUDY (*)    Hgb urine dipstick SMALL (*)    Leukocytes, UA TRACE (*)    Bacteria, UA RARE (*)    All other components within normal limits  LIPASE, BLOOD  ABO/RH  RH IG WORKUP (INCLUDES ABO/RH)  GC/CHLAMYDIA PROBE AMP ([place]     Radiology [place] Ob Comp < 14 Wks  Result Date: 05/09/2018 CLINICAL DATA:  Initial evaluation for vaginal spotting, early pregnancy. EXAM: OBSTETRIC <14 WK US AND TRANSVAGINAL OB US TECHNIQUE: Both transabdominal and transvaginal ultrasound examinations were performed for complete evaluation of the gestation as well as the maternal uterus, adnexal regions, and pelvic cul-de-sac. Transvaginal technique was performed to assess early pregnancy. COMPARISON:  None. FINDINGS: Intrauterine gestational sac: Single Yolk sac:  Present Embryo:  Present Cardiac Activity: Present Heart Rate: Visible cardiac  activity is noted, although exact heart rate unable to be determined at this time due to small size/early pregnancy. CRL: 3.2 mm   6 w   0 d                  US EDC: 01/02/2019 Subchorionic hemorrhage: Small to moderate subchorionic hemorrhage measuring 2.9 x 0.8 x 1.7 cm. Maternal uterus/adnexae: Ovaries normal in appearance bilaterally. Corpus luteal cyst noted on the right. Small volume free fluid within the pelvis. IMPRESSION: 1. Single live intrauterine pregnancy as above, estimated gestational age [redacted] weeks and 0 day by crown-rump length. 2. Small to moderate subchorionic hemorrhage measuring up to 2.9 cm without significant mass effect. 3. No other acute maternal uterine or adnexal abnormality. Electronically Signed   By: Rise MuBenjamin  McClintock M.D.   On: 05/09/2018 23:30   [place] Ob Transvaginal  Result Date: 05/09/2018 CLINICAL DATA:  Initial evaluation for vaginal spotting, early pregnancy. EXAM: OBSTETRIC <14 WK US AND TRANSVAGINAL OB US TECHNIQUE: Both transabdominal and transvaginal ultrasound examinations were performed for complete evaluation of the gestation as well as the maternal uterus, adnexal regions, and pelvic cul-de-sac. Transvaginal technique was performed to assess early pregnancy. COMPARISON:  None. FINDINGS: Intrauterine gestational sac: Single Yolk sac:  Present Embryo:  Present Cardiac Activity: Present Heart Rate: Visible cardiac activity is noted, although exact heart rate unable to be determined at this time due to small size/early pregnancy. CRL: 3.2 mm   6 w   0 d                  US EDC: 01/02/2019 Subchorionic hemorrhage: Small to moderate subchorionic hemorrhage measuring 2.9 x 0.8 x 1.7 cm. Maternal uterus/adnexae: Ovaries normal in appearance bilaterally. Corpus luteal cyst noted on the right. Small volume free fluid within the pelvis. IMPRESSION: 1. Single live intrauterine pregnancy as above, estimated gestational age [redacted] weeks and 0 day by crown-rump length. 2. Small to moderate  subchorionic hemorrhage measuring up to 2.9 cm without significant mass effect. 3. No other acute maternal uterine or adnexal abnormality. Electronically Signed   By: Rise MuBenjamin  McClintock M.D.   On: 05/09/2018 23:30    Procedures Procedures (including critical care time)  Medications Ordered in ED Medications  rho (d) immune globulin (RHIG/RHOPHYLAC) injection 300 mcg (300 mcg Intramuscular Given 05/10/18 0252)     Initial Impression / Assessment and Plan / ED Course  I have reviewed the triage vital signs and the  nursing notes.  Pertinent labs & imaging results that were available during my care of the patient were reviewed by me and considered in my medical decision making (see chart for details).     Patient presents from home with positive pregnancy test and vaginal bleeding.  No leukocytosis or fever.  No evidence of sepsis or chorioamnionitis.  Abdomen is soft and nontender.  No cervical motion or adnexal tenderness on pelvic exam.  Small amount of blood in the vaginal vault but no purulent discharge.  CBC does show anemia with hemoglobin of 9.9, slightly below baseline.  Patient reports she has a long history of anemia and has not been taking her iron recently.  I have encouraged her to resume this.  Urinalysis without evidence of urinary tract infection.  Ultrasound shows IUP at 6 weeks and 0 days.  Additionally there is a small to moderate sized subchorionic hemorrhage.  This is likely the source of her bleeding.  Patient is O- therefore was given RhoGam.  Patient hypertensive here in the emergency department.  She reports this is common for her and blood pressure is at baseline.  She is not on any medications currently.  No elevation in AST or ALT, no decrease in her platelets.  No evidence of HELLP syndrome.  Long discussion with patient about all of these things.  We will begin taking a prenatal vitamin and continue with her iron.  She is to follow-up with OB/GYN this week for further  evaluation and treatment of her high blood pressure.  Patient states understanding and is in agreement with this plan.  Final Clinical Impressions(s) / ED Diagnoses   Final diagnoses:  Vaginal bleeding in pregnancy  Threatened miscarriage  Subchorionic hemorrhage of placenta in first trimester, single or unspecified fetus  Iron deficiency anemia, unspecified iron deficiency anemia type  Hypertension, unspecified type    ED Discharge Orders        Ordered    Prenatal Vit-Fe Fumarate-FA (PRENATAL COMPLETE) 14-0.4 MG TABS  Daily     05/10/18 0304       Kamarri Lovvorn, Dahlia Client, PA-C 05/10/18 4098    Pricilla Loveless, MD 05/10/18 1520

## 2018-05-10 LAB — COMPREHENSIVE METABOLIC PANEL
ALBUMIN: 3.8 g/dL (ref 3.5–5.0)
ALK PHOS: 48 U/L (ref 38–126)
ALT: 14 U/L (ref 14–54)
ANION GAP: 5 (ref 5–15)
AST: 12 U/L — ABNORMAL LOW (ref 15–41)
BUN: 9 mg/dL (ref 6–20)
CALCIUM: 8.8 mg/dL — AB (ref 8.9–10.3)
CHLORIDE: 106 mmol/L (ref 101–111)
CO2: 25 mmol/L (ref 22–32)
Creatinine, Ser: 0.65 mg/dL (ref 0.44–1.00)
GFR calc Af Amer: >60 mL/min (ref >60)
GFR calc non Af Amer: >60 mL/min (ref >60)
GLUCOSE: 94 mg/dL (ref 65–99)
Potassium: 3.5 mmol/L (ref 3.5–5.1)
SODIUM: 136 mmol/L (ref 135–145)
Total Bilirubin: 0.4 mg/dL (ref 0.3–1.2)
Total Protein: 7.3 g/dL (ref 6.5–8.1)

## 2018-05-10 LAB — ABO/RH
ABO/RH(D): O NEG
Antibody Screen: NEGATIVE

## 2018-05-10 LAB — WET PREP, GENITAL
CLUE CELLS WET PREP: NONE SEEN
SPERM: NONE SEEN
Trich, Wet Prep: NONE SEEN
Yeast Wet Prep HPF POC: NONE SEEN

## 2018-05-10 LAB — GC/CHLAMYDIA PROBE AMP (~~LOC~~) NOT AT ARMC
CHLAMYDIA, DNA PROBE: NEGATIVE
NEISSERIA GONORRHEA: NEGATIVE

## 2018-05-10 LAB — LIPASE, BLOOD: Lipase: 31 U/L (ref 11–51)

## 2018-05-10 MED ORDER — PRENATAL COMPLETE 14-0.4 MG PO TABS: 2.0000 | ORAL_TABLET | Freq: Every day | ORAL | 0 refills | Status: AC

## 2018-05-10 MED ORDER — RHO D IMMUNE GLOBULIN 1500 UNIT/2ML IJ SOSY
300.0000 ug | PREFILLED_SYRINGE | Freq: Once | INTRAMUSCULAR | Status: AC
  Administered 2018-05-10: 300 ug via INTRAMUSCULAR
  Filled 2018-05-10: qty 2

## 2018-05-10 NOTE — Discharge Instructions (Addendum)
1. Medications: Prenatal vitamins, usual home medications 2. Treatment: rest, drink plenty of fluids, refrain from sexual intercourse 3. Follow Up: Please followup with OB/GYN in 3-5 days for discussion of your diagnoses and further evaluation after today's visit; if you do not have a primary care doctor use the resource guide provided to find one; Please return to the ER or [facility] for fevers, chills, persistent vomiting, worsening abdominal pain, worsening vaginal bleeding or any other concerns

## 2018-05-11 LAB — RH IG WORKUP (INCLUDES ABO/RH)
ABO/RH(D): O NEG
Antibody Screen: NEGATIVE
GESTATIONAL AGE(WKS): 6
UNIT DIVISION: 0

## 2018-05-22 ENCOUNTER — Encounter (HOSPITAL_COMMUNITY): Payer: Self-pay

## 2018-05-22 ENCOUNTER — Inpatient Hospital Stay (HOSPITAL_COMMUNITY): Payer: Self-pay

## 2018-05-22 ENCOUNTER — Inpatient Hospital Stay (HOSPITAL_COMMUNITY)
Admission: AD | Admit: 2018-05-22 | Discharge: 2018-05-22 | Disposition: A | Discharge status: Home / Self Care | Payer: Self-pay | Source: Ambulatory Visit | Attending: Obstetrics and Gynecology | Admitting: Obstetrics and Gynecology

## 2018-05-22 DIAGNOSIS — O021 Missed abortion: Secondary | ICD-10-CM | POA: Insufficient documentation

## 2018-05-22 DIAGNOSIS — O209 Hemorrhage in early pregnancy, unspecified: Secondary | ICD-10-CM

## 2018-05-22 DIAGNOSIS — F17200 Nicotine dependence, unspecified, uncomplicated: Secondary | ICD-10-CM | POA: Insufficient documentation

## 2018-05-22 HISTORY — DX: Essential (primary) hypertension: I10

## 2018-05-22 HISTORY — DX: Anemia, unspecified: D64.9

## 2018-05-22 LAB — URINALYSIS, ROUTINE W REFLEX MICROSCOPIC
BILIRUBIN URINE: NEGATIVE
Glucose, UA: NEGATIVE mg/dL
Ketones, ur: NEGATIVE mg/dL
LEUKOCYTES UA: NEGATIVE
NITRITE: NEGATIVE
PH: 7 (ref 5.0–8.0)
Protein, ur: NEGATIVE mg/dL
Specific Gravity, Urine: 1.016 (ref 1.005–1.030)

## 2018-05-22 MED ORDER — OXYCODONE-ACETAMINOPHEN 5-325 MG PO TABS: 1.0000 | ORAL_TABLET | Freq: Four times a day (QID) | ORAL | 0 refills | Status: DC | PRN

## 2018-05-22 NOTE — MAU Provider Note (Addendum)
History     CSN: [id number]  Arrival date and time: 05/22/18 [id number]   First Provider Initiated Contact with Patient 05/22/18 2151      Chief Complaint  Patient presents with  . Vaginal Bleeding   HPI  Tammy Hull is a 27 y.o. G1P0 at 2567w6d who presents with vaginal bleeding. [facility] Had ultrasound that showed IUP and moderate SCH. Reports increase in bleeding today and passed a golf ball sized clot. Mild lower abdominal cramping started today with the heavier bleeding. Rates pain 2/10. Has not treated.   OB History    Gravida  1   Para      Term      Preterm      AB      Living        SAB      TAB      Ectopic      Multiple      Live Births              History reviewed. No pertinent past medical history.  No past surgical history on file.  Family History  Problem Relation Age of Onset  . Hypertension Mother   . Asthma Father   . Hypertension Father   . Hypertension Maternal Grandmother   . Hypertension Maternal Grandfather   . Cancer Maternal Grandfather        lung cancer  . Diabetes Paternal Grandmother   . Hypertension Paternal Grandmother   . Hyperlipidemia Paternal Grandmother   . Hypertension Paternal Grandfather     Social History   Tobacco Use  . Smoking status: Current Some Day Smoker  . Smokeless tobacco: Never Used  Substance Use Topics  . Alcohol use: No  . Drug use: No    Allergies: No Known Allergies  Medications Prior to Admission  Medication Sig Dispense Refill Last Dose  . ferrous sulfate 325 (65 FE) MG EC tablet Take 325 mg by mouth 3 (three) times daily with meals.   05/22/2018 at Unknown time  . Prenatal Vit-Fe Fumarate-FA (PRENATAL COMPLETE) 14-0.4 MG TABS Take 2 tablets by mouth daily. 60 each 0 05/22/2018 at Unknown time    Review of Systems  Constitutional: Negative.   Gastrointestinal: Positive for abdominal pain.  Genitourinary: Positive for vaginal bleeding.   Physical Exam    Blood pressure (!) 145/87, pulse 79, temperature 98.4 F (36.9 C), temperature source Oral, resp. rate 18, height 5\' 11"  (1.803 m), weight 243 lb (110.2 kg), last menstrual period 03/14/2018.  Physical Exam  Nursing note and vitals reviewed. Constitutional: She is oriented to person, place, and time. She appears well-developed and well-nourished. No distress.  HENT:  Head: Normocephalic and atraumatic.  Eyes: Conjunctivae are normal. Right eye exhibits no discharge. Left eye exhibits no discharge. No scleral icterus.  Neck: Normal range of motion.  Respiratory: Effort normal. No respiratory distress.  GI: Soft. She exhibits no distension. There is no tenderness.  Neurological: She is alert and oriented to person, place, and time.  Skin: Skin is warm and dry. She is not diaphoretic.  Psychiatric: She has a normal mood and affect. Her behavior is normal. Judgment and thought content normal.    MAU Course  Procedures Results for orders placed or performed during the hospital encounter of 05/22/18 (from the past 24 hour(s))  Urinalysis, Routine w reflex microscopic     Status: Abnormal   Collection Time: 05/22/18  7:41 PM  Result Value Ref Range  Color, Urine YELLOW YELLOW   APPearance CLEAR CLEAR   Specific Gravity, Urine 1.016 1.005 - 1.030   pH 7.0 5.0 - 8.0   Glucose, UA NEGATIVE NEGATIVE mg/dL   Hgb urine dipstick MODERATE (A) NEGATIVE   Bilirubin Urine NEGATIVE NEGATIVE   Ketones, ur NEGATIVE NEGATIVE mg/dL   Protein, ur NEGATIVE NEGATIVE mg/dL   Nitrite NEGATIVE NEGATIVE   Leukocytes, UA NEGATIVE NEGATIVE   RBC / HPF 21-50 0 - 5 RBC/hpf   WBC, UA 0-5 0 - 5 WBC/hpf   Bacteria, UA RARE (A) NONE SEEN   Squamous Epithelial / LPF 0-5 0 - 5   US Ob Transvaginal  Result Date: 05/22/2018 CLINICAL DATA:  Initial evaluation for heavy vaginal bleeding for 2 days. EXAM: OBSTETRIC <14 WK [place] AND TRANSVAGINAL OB US TECHNIQUE: Both transabdominal and transvaginal ultrasound  examinations were performed for complete evaluation of the gestation as well as the maternal uterus, adnexal regions, and pelvic cul-de-sac. Transvaginal technique was performed to assess early pregnancy. COMPARISON:  Prior ultrasound from 05/09/2018. FINDINGS: Intrauterine gestational sac: Single Yolk sac:  Not visualized. Embryo:  Present Cardiac Activity: Negative. Heart Rate: Negative.  bpm CRL: 7.8 mm   6 w   4 d                  [place] EDC: 01/11/2018 Subchorionic hemorrhage: Large subchorionic hemorrhage measuring 3.5 x 3.0 x 3.3 cm. Maternal uterus/adnexae: Left ovary unremarkable. Corpus luteal cyst noted within the right ovary. Small volume free fluid present within the pelvis. IMPRESSION: 1. Single intrauterine pregnancy, with crown-rump length measuring 7.8 mm, with no detectable cardiac activity. Findings meet definitive criteria for failed pregnancy. This follows SRU consensus guidelines: Diagnostic Criteria for Nonviable Pregnancy Early in the First Trimester. N Engl J Med [phone]. 2. Large subchorionic hemorrhage. Electronically Signed   By: Rise Mu M.D.   On: 05/22/2018 22:32     MDM O negative -- given rhogam in ED on 6/4 Pt has known CHTN. Not being treated. Does not have PCP. No headache, CP, or SOB.   Ultrasound shows minimal growth in last 2 weeks and no cardiac activity in embryo measuring 7.8 mm. Meets definitive criteria for failed pregnancy.   Discussed options for management of incomplete AB including expectant management, Cytotec or D&C. Prefers expectant management at this time. Verbalizes understanding that intervention may become necessary if SAB in not completed spontaneously or if heavy bleeding or infection occur.   Assessment and Plan  A: 1. Missed abortion   2. Vaginal bleeding in pregnancy, first trimester    P: Discharge home Pelvic rest Msg to [facility] for f/u appt Rx percocet #10 Discussed reasons to return to MAU  Tammy Hull 05/22/2018, 9:51 PM

## 2018-05-22 NOTE — MAU Note (Addendum)
Pt reports that she is [redacted] weeks pregnant and started having heavier bleeding today. States she passed a golf ball sized clot today. Pt reports some mild cramping that started after the heavier bleeding started. [facility] and was told she had a subchorionic hemorrhage.

## 2018-05-22 NOTE — Discharge Instructions (Signed)
Return to care  °· If you have heavier bleeding that soaks through more that 2 pads per hour for an hour or more °· If you bleed so much that you feel like you might pass out or you do pass out °· If you have significant abdominal pain that is not improved with Tylenol  °· If you develop a fever > 100.5 ° ° ° ° °Miscarriage °A miscarriage is the sudden loss of an unborn baby (fetus) before the 20th week of pregnancy. Most miscarriages happen in the first 3 months of pregnancy. Sometimes, it happens before a woman even knows she is pregnant. A miscarriage is also called a "spontaneous miscarriage" or "early pregnancy loss." Having a miscarriage can be an emotional experience. Talk with your caregiver about any questions you may have about miscarrying, the grieving process, and your future pregnancy plans. °What are the causes? °· Problems with the fetal chromosomes that make it impossible for the baby to develop normally. Problems with the baby's genes or chromosomes are most often the result of errors that occur, by chance, as the embryo divides and grows. The problems are not inherited from the parents. °· Infection of the cervix or uterus. °· Hormone problems. °· Problems with the cervix, such as having an incompetent cervix. This is when the tissue in the cervix is not strong enough to hold the pregnancy. °· Problems with the uterus, such as an abnormally shaped uterus, uterine fibroids, or congenital abnormalities. °· Certain medical conditions. °· Smoking, drinking alcohol, or taking illegal drugs. °· Trauma. °Often, the cause of a miscarriage is unknown. °What are the signs or symptoms? °· Vaginal bleeding or spotting, with or without cramps or pain. °· Pain or cramping in the abdomen or lower back. °· Passing fluid, tissue, or blood clots from the vagina. °How is this diagnosed? °Your caregiver will perform a physical exam. You may also have an ultrasound to confirm the miscarriage. Blood or urine tests may  also be ordered. °How is this treated? °· Sometimes, treatment is not necessary if you naturally pass all the fetal tissue that was in the uterus. If some of the fetus or placenta remains in the body (incomplete miscarriage), tissue left behind may become infected and must be removed. Usually, a dilation and curettage (D and C) procedure is performed. During a D and C procedure, the cervix is widened (dilated) and any remaining fetal or placental tissue is gently removed from the uterus. °· Antibiotic medicines are prescribed if there is an infection. Other medicines may be given to reduce the size of the uterus (contract) if there is a lot of bleeding. °· If you have Rh negative blood and your baby was Rh positive, you will need a Rh immunoglobulin shot. This shot will protect any future baby from having Rh blood problems in future pregnancies. °Follow these instructions at home: °· Your caregiver may order bed rest or may allow you to continue light activity. Resume activity as directed by your caregiver. °· Have someone help with home and family responsibilities during this time. °· Keep track of the number of sanitary pads you use each day and how soaked (saturated) they are. Write down this information. °· Do not use tampons. Do not douche or have sexual intercourse until approved by your caregiver. °· Only take over-the-counter or prescription medicines for pain or discomfort as directed by your caregiver. °· Do not take aspirin. Aspirin can cause bleeding. °· Keep all follow-up appointments with your caregiver. °·   If you or your partner have problems with grieving, talk to your caregiver or seek counseling to help cope with the pregnancy loss. Allow enough time to grieve before trying to get pregnant again. °Get help right away if: °· You have severe cramps or pain in your back or abdomen. °· You have a fever. °· You pass large blood clots (walnut-sized or larger) or tissue from your vagina. Save any tissue  for your caregiver to inspect. °· Your bleeding increases. °· You have a thick, bad-smelling vaginal discharge. °· You become lightheaded, weak, or you faint. °· You have chills. °This information is not intended to replace advice given to you by your health care provider. Make sure you discuss any questions you have with your health care provider. °Document Released: 05/18/2001 Document Revised: 04/29/2016 Document Reviewed: 01/11/2012 °Elsevier Interactive Patient Education © 2017 [employer] ° °

## 2018-05-23 ENCOUNTER — Telehealth: Payer: Self-pay | Admitting: General Practice

## 2018-05-23 NOTE — Telephone Encounter (Signed)
Left message for patient to give our office a call to schedule appointment.  Patient will need to be scheduled for SAB follow up. Recently visited MAU on 05/22/18.

## 2018-10-03 ENCOUNTER — Ambulatory Visit (HOSPITAL_COMMUNITY)
Admission: EM | Admit: 2018-10-03 | Discharge: 2018-10-03 | Disposition: A | Discharge status: Home / Self Care | Payer: Self-pay | Attending: Family Medicine | Admitting: Family Medicine

## 2018-10-03 ENCOUNTER — Other Ambulatory Visit: Payer: Self-pay

## 2018-10-03 ENCOUNTER — Encounter (HOSPITAL_COMMUNITY): Payer: Self-pay

## 2018-10-03 DIAGNOSIS — H6122 Impacted cerumen, left ear: Secondary | ICD-10-CM

## 2018-10-03 MED ORDER — CARBAMIDE PEROXIDE 6.5 % OT SOLN: 5.0000 [drp] | Freq: Two times a day (BID) | OTIC | 0 refills | Status: AC

## 2018-10-03 NOTE — ED Triage Notes (Signed)
Pt presents to [facility] for facial swelling on left side x2 days, pt states she was previously having ear pain now face has started to swell and become painful.

## 2018-10-03 NOTE — Discharge Instructions (Addendum)
Use the Debrox once a month in each ear.  This is a medicine that loosens earwax.  Let us soak for a few minutes then gently rinse.  Come back here or to your PCP if you have trouble with earwax buildup again

## 2018-10-03 NOTE — ED Provider Notes (Signed)
[facility]    CSN: [id number] Arrival date & time: 10/03/18  4098     History   Chief Complaint Chief Complaint  Patient presents with  . Facial Swelling    HPI Tammy Hull is a 27 y.o. female.   HPI Patient has pain pressure in her left ear, decreased hearing.  She tends to have recurring earwax infection.  Other ear is fine.  No cough cold or runny nose.  No sinus congestion.  No sore throat.  No fever chills.  No nausea vomiting.  She feels that she has some facial swelling just in front of the left ear.  Is not tender.  No dental problems noted.  She is otherwise healthy. Past Medical History:  Diagnosis Date  . Anemia   . Hypertension     Patient Active Problem List   Diagnosis Date Noted  . Allergic rhinitis 11/07/2013  . Trapezius muscle spasm 11/07/2013  . Allergic reaction 08/30/2013  . GERD 01/19/2011  . PARESTHESIA 01/19/2011  . CHEST PAIN 01/19/2011  . SUPRAPUBIC PAIN 01/19/2011  . COUGH VARIANT ASTHMA 02/18/2010  . INCREASED BLOOD PRESSURE 02/18/2010    History reviewed. No pertinent surgical history.  OB History    Gravida  1   Para      Term      Preterm      AB      Living        SAB      TAB      Ectopic      Multiple      Live Births               Home Medications    Prior to Admission medications   Medication Sig Start Date End Date Taking? Authorizing Provider  carbamide peroxide (DEBROX) 6.5 % OTIC solution Place 5 drops into both ears 2 (two) times daily. DO ONE TIME A MONTH 10/03/18   Eustace Moore, MD  ferrous sulfate 325 (65 FE) MG EC tablet Take 325 mg by mouth 3 (three) times daily with meals.    [provider]  Prenatal Vit-Fe Fumarate-FA (PRENATAL COMPLETE) 14-0.4 MG TABS Take 2 tablets by mouth daily. 05/10/18   Muthersbaugh, Dahlia Client, PA-C    Family History Family History  Problem Relation Age of Onset  . Hypertension Mother   . Asthma Father   . Hypertension Father   .  Hypertension Maternal Grandmother   . Hypertension Maternal Grandfather   . Cancer Maternal Grandfather        lung cancer  . Diabetes Paternal Grandmother   . Hypertension Paternal Grandmother   . Hyperlipidemia Paternal Grandmother   . Hypertension Paternal Grandfather     Social History Social History   Tobacco Use  . Smoking status: Former Smoker    Last attempt to quit: 05/22/2018    Years since quitting: 0.3  . Smokeless tobacco: Never Used  Substance Use Topics  . Alcohol use: No  . Drug use: Yes    Types: Marijuana    Comment: last time May 2019     Allergies   Patient has no known allergies.   Review of Systems Review of Systems  Constitutional: Negative for chills and fever.  HENT: Positive for ear pain, facial swelling and hearing loss. Negative for sore throat.   Eyes: Negative for pain and visual disturbance.  Respiratory: Negative for cough and shortness of breath.   Cardiovascular: Negative for chest pain and palpitations.  Gastrointestinal: Negative for abdominal pain and vomiting.  Genitourinary: Negative for dysuria and hematuria.  Musculoskeletal: Negative for arthralgias and back pain.  Skin: Negative for color change and rash.  Neurological: Negative for seizures and syncope.  All other systems reviewed and are negative.    Physical Exam Triage Vital Signs ED Triage Vitals  Enc Vitals Group     BP 10/03/18 2002 (!) 147/98     Pulse Rate 10/03/18 2002 77     Resp 10/03/18 2002 17     Temp 10/03/18 2002 98 F (36.7 C)     Temp Source 10/03/18 2002 Oral     SpO2 10/03/18 2002 100 %     Weight --      Height --      Head Circumference --      Peak Flow --      Pain Score 10/03/18 2004 2     Pain Loc --      Pain Edu? --      Excl. in GC? --    No data found.  Updated Vital Signs BP (!) 147/98 (BP Location: Left Arm)   Pulse 77   Temp 98 F (36.7 C) (Oral)   Resp 17   LMP 09/14/2018 (Exact Date)   SpO2 100%   Breastfeeding?  Unknown       Physical Exam  Constitutional: She appears well-developed and well-nourished. No distress.  HENT:  Head: Normocephalic and atraumatic.  Right Ear: External ear normal.  Left Ear: External ear normal.  Nose: Nose normal.  Mouth/Throat: No oropharyngeal exudate.  Left ear canal occluded by cerumen.  There is slight soft tissue swelling in front of the left ear  Eyes: Pupils are equal, round, and reactive to light. Conjunctivae are normal.  Neck: Normal range of motion.  Cardiovascular: Normal rate, regular rhythm and normal heart sounds.  Pulmonary/Chest: Effort normal and breath sounds normal. No respiratory distress.  Abdominal: Soft. She exhibits no distension.  Musculoskeletal: Normal range of motion. She exhibits no edema.  Lymphadenopathy:    She has no cervical adenopathy.  Neurological: She is alert.  Skin: Skin is warm and dry.  After irrigation and lavage, some instrumentation, eardrum is visible and normal.  Slight wax remains.   UC Treatments / Results  Labs (all labs ordered are listed, but only abnormal results are displayed) Labs Reviewed - No data to display  EKG None  Radiology No results found.  Procedures Procedures (including critical care time)  Medications Ordered in UC Medications - No data to display  Initial Impression / Assessment and Plan / UC Course  I have reviewed the triage vital signs and the nursing notes.  Pertinent labs & imaging results that were available during my care of the patient were reviewed by me and considered in my medical decision making (see chart for details).      Final Clinical Impressions(s) / UC Diagnoses   Final diagnoses:  Hearing loss of left ear due to cerumen impaction     Discharge Instructions      Use the Debrox once a month in each ear.  This is a medicine that loosens earwax.  Let us soak for a few minutes then gently rinse.  Come back here or to your PCP if you have trouble with  earwax buildup again    ED Prescriptions    Medication Sig Dispense Auth. Provider   carbamide peroxide (DEBROX) 6.5 % OTIC solution Place 5 drops into both ears 2 (  two) times daily. DO ONE TIME A MONTH 15 mL Eustace Moore, MD     Controlled Substance Prescriptions [place] Controlled Substance Registry consulted? Not Applicable   Eustace Moore, MD 10/03/18 2050

## 2019-07-12 IMAGING — US US OB TRANSVAGINAL
1 series · 15 of 28 positions shown · non-contrast
Comparison: Prior ultrasound from 05/09/2018.

CLINICAL DATA: Initial evaluation for heavy vaginal bleeding for 2
days.

EXAM:
OBSTETRIC <14 WK US AND TRANSVAGINAL OB US
TECHNIQUE: Both transabdominal and transvaginal ultrasound examinations were
performed for complete evaluation of the gestation as well as the
maternal uterus, adnexal regions, and pelvic cul-de-sac.
Transvaginal technique was performed to assess early pregnancy.

[Series 1: us ob transvaginal · 38 acquisitions, 15 frames shown]
[im 1/38]
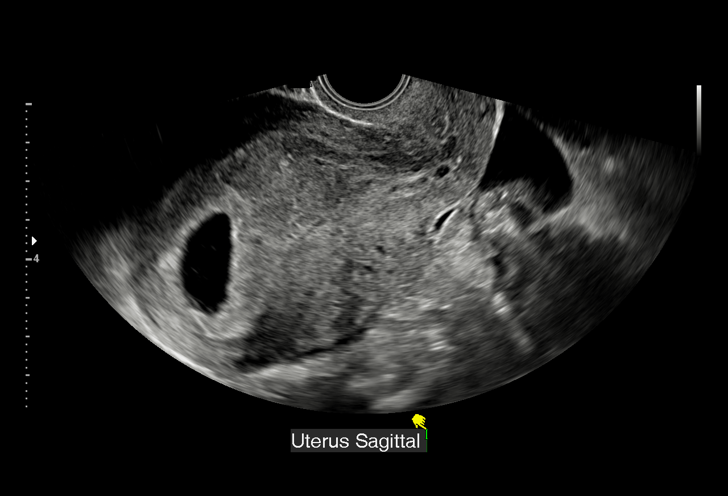
[im 3/38]
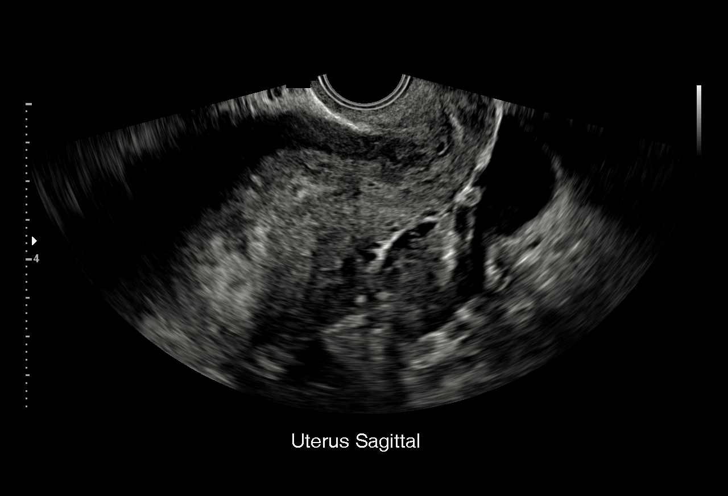
[im 6/38]
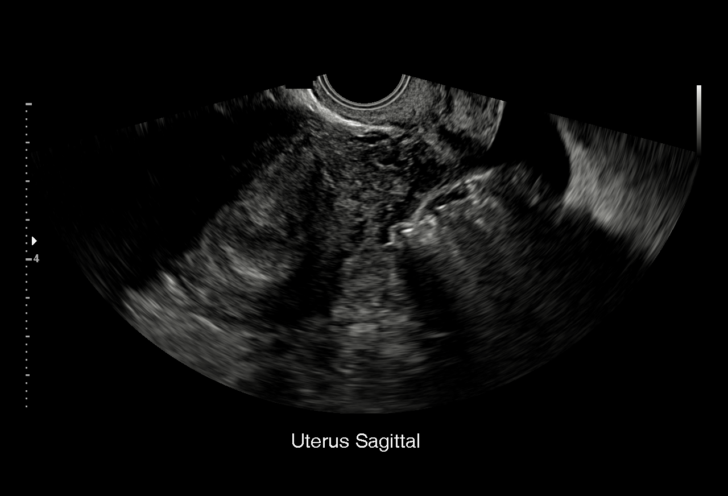
[im 9/38]
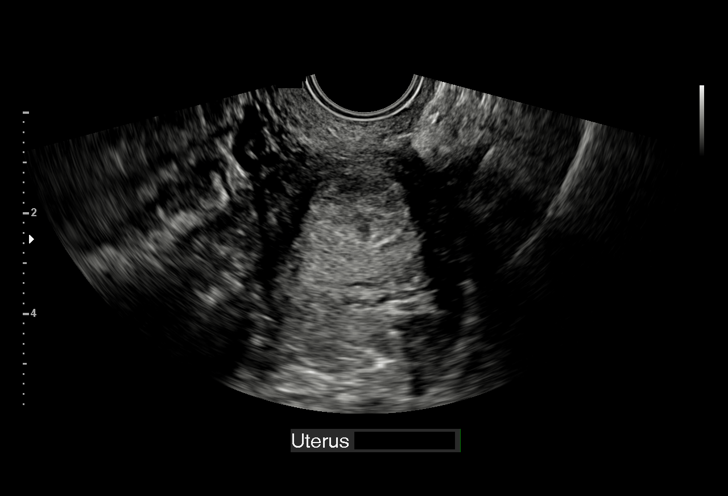
[im 11/38]
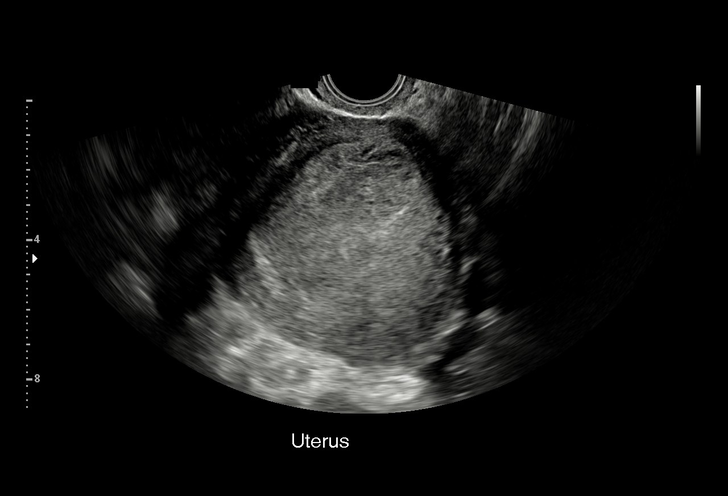
[im 14/38]
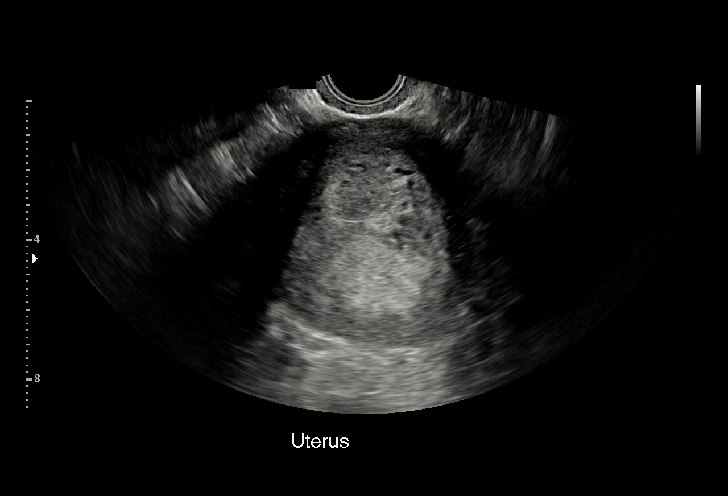
[im 17/38]
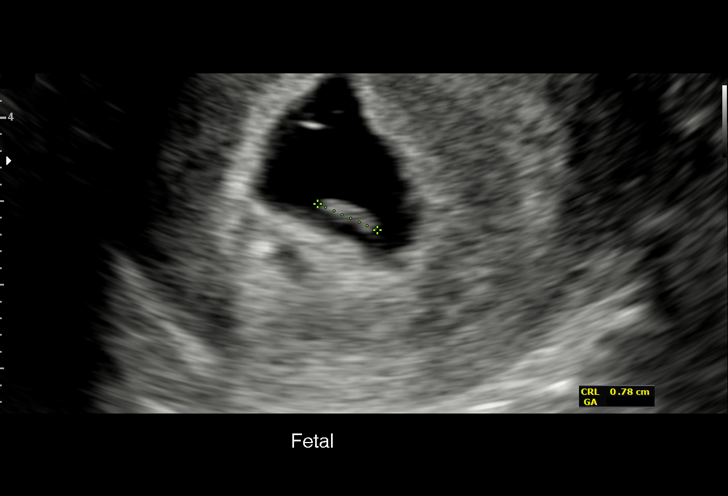
[im 20/38]
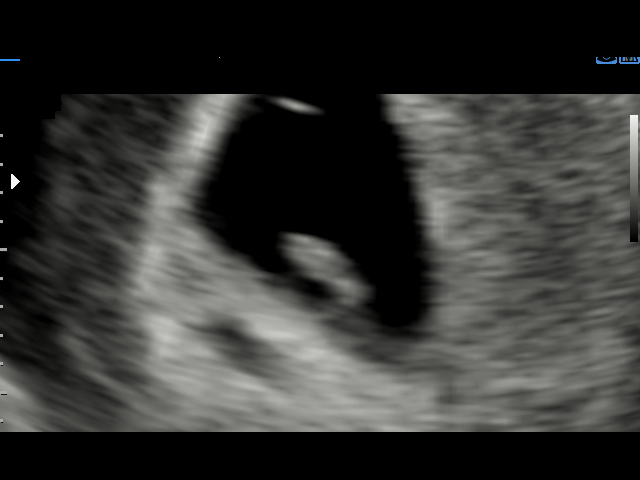
[im 21/38]
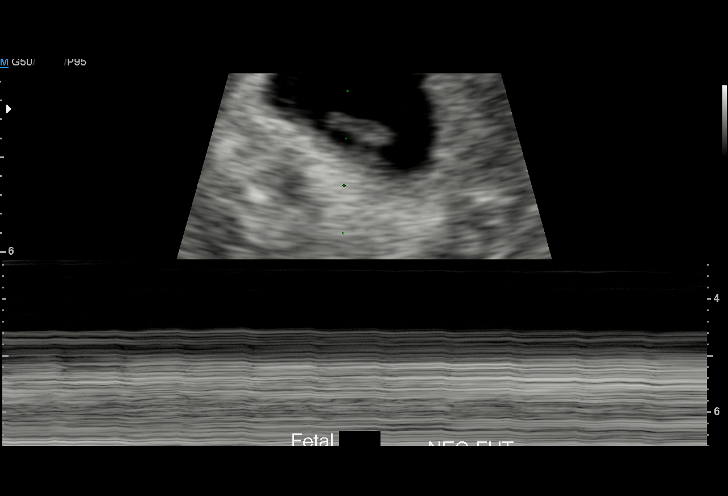
[im 24/38]
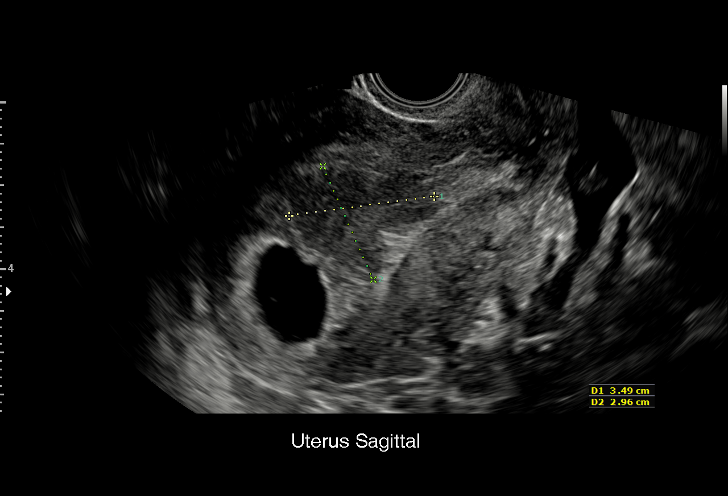
[im 27/38]
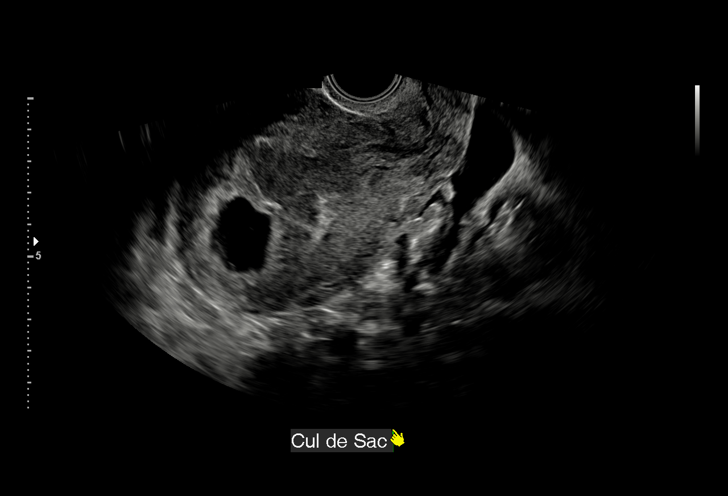
[im 29/38]
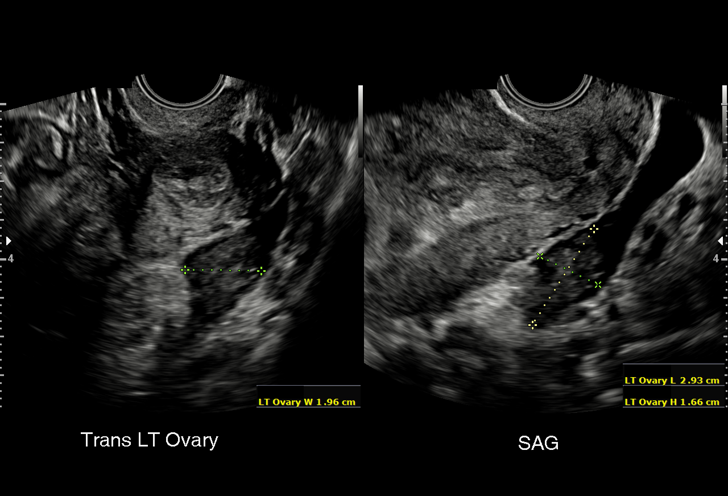
[im 32/38]
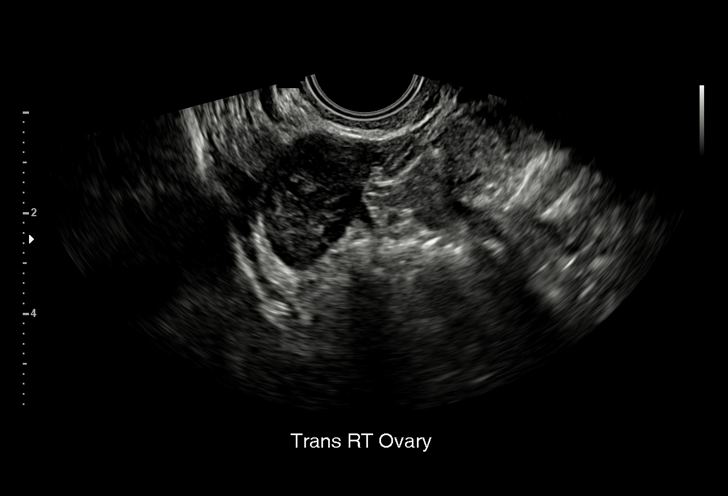
[im 35/38]
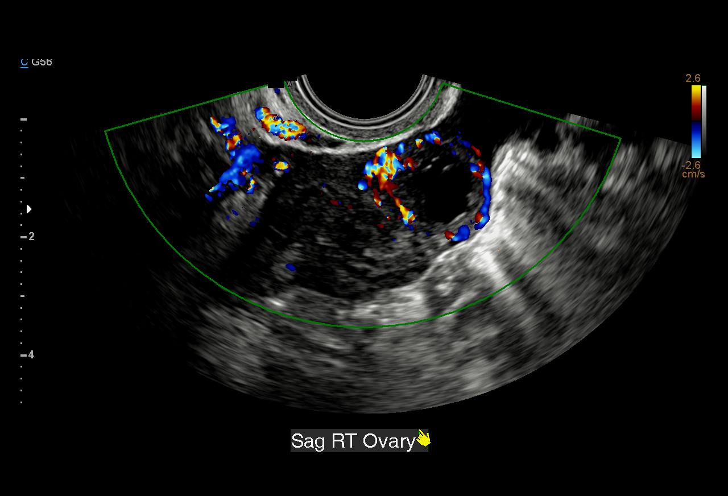
[im 38/38]
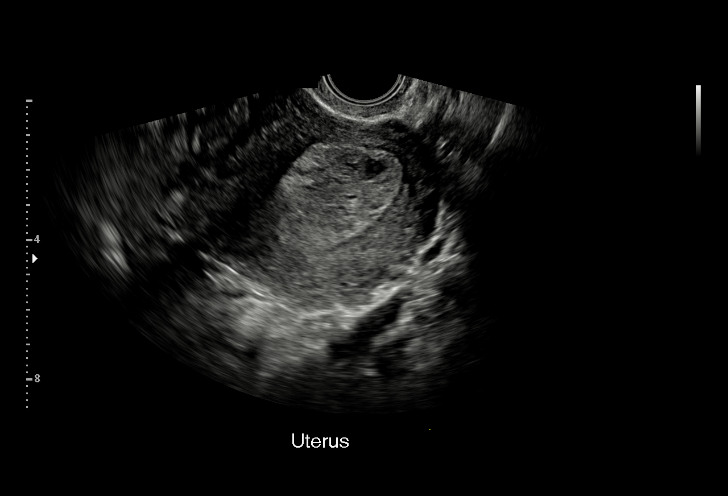

[15 of 28 positions shown; findings below may reference images not displayed]

FINDINGS: Intrauterine gestational sac: Single

Yolk sac:  Not visualized.

Embryo:  Present

Cardiac Activity: Negative.

Heart Rate: Negative.  bpm

CRL: 7.8 mm   6 w   4 d                  US EDC: 01/11/2018

Subchorionic hemorrhage: Large subchorionic hemorrhage measuring
x 3.0 x 3.3 cm.

Maternal uterus/adnexae: Left ovary unremarkable. Corpus luteal cyst
noted within the right ovary. Small volume free fluid present within
the pelvis.
IMPRESSION: 1. Single intrauterine pregnancy, with crown-rump length measuring
7.8 mm, with no detectable cardiac activity. Findings meet
definitive criteria for failed pregnancy. This follows SRU consensus
guidelines: Diagnostic Criteria for Nonviable Pregnancy Early in the
First Trimester. N Engl J Med 9980;[DATE].
2. Large subchorionic hemorrhage.
